# Patient Record
Sex: Female | Born: 1970 | Race: Black or African American | Hispanic: No | Marital: Single | Smoking: Current every day smoker
Health system: Southern US, Community
[De-identification: ages and names within clinical notes are randomized; demographics above are authoritative.]

## PROBLEM LIST (undated history)

## (undated) DIAGNOSIS — N760 Acute vaginitis: Secondary | ICD-10-CM

## (undated) DIAGNOSIS — M549 Dorsalgia, unspecified: Secondary | ICD-10-CM

## (undated) DIAGNOSIS — F32A Depression, unspecified: Secondary | ICD-10-CM

## (undated) DIAGNOSIS — E119 Type 2 diabetes mellitus without complications: Secondary | ICD-10-CM

## (undated) DIAGNOSIS — N730 Acute parametritis and pelvic cellulitis: Secondary | ICD-10-CM

## (undated) DIAGNOSIS — F4310 Post-traumatic stress disorder, unspecified: Secondary | ICD-10-CM

## (undated) DIAGNOSIS — R079 Chest pain, unspecified: Secondary | ICD-10-CM

## (undated) DIAGNOSIS — F329 Major depressive disorder, single episode, unspecified: Secondary | ICD-10-CM

## (undated) DIAGNOSIS — B9689 Other specified bacterial agents as the cause of diseases classified elsewhere: Secondary | ICD-10-CM

## (undated) HISTORY — PX: APPENDECTOMY: SHX54

## (undated) HISTORY — DX: Other specified bacterial agents as the cause of diseases classified elsewhere: B96.89

## (undated) HISTORY — DX: Acute parametritis and pelvic cellulitis: N73.0

## (undated) HISTORY — DX: Dorsalgia, unspecified: M54.9

## (undated) HISTORY — DX: Other specified bacterial agents as the cause of diseases classified elsewhere: N76.0

## (undated) HISTORY — DX: Chest pain, unspecified: R07.9

---

## 2006-07-18 ENCOUNTER — Emergency Department (HOSPITAL_COMMUNITY): Admission: EM | Admit: 2006-07-18 | Discharge: 2006-07-19 | Payer: Self-pay | Admitting: Emergency Medicine

## 2006-08-21 ENCOUNTER — Emergency Department (HOSPITAL_COMMUNITY): Admission: EM | Admit: 2006-08-21 | Discharge: 2006-08-22 | Payer: Self-pay | Admitting: Emergency Medicine

## 2007-01-09 ENCOUNTER — Ambulatory Visit: Payer: Self-pay | Admitting: Internal Medicine

## 2007-02-18 ENCOUNTER — Emergency Department (HOSPITAL_COMMUNITY): Admission: EM | Admit: 2007-02-18 | Discharge: 2007-02-18 | Payer: Self-pay | Admitting: Emergency Medicine

## 2007-04-10 ENCOUNTER — Telehealth (INDEPENDENT_AMBULATORY_CARE_PROVIDER_SITE_OTHER): Payer: Self-pay | Admitting: Internal Medicine

## 2007-04-21 ENCOUNTER — Emergency Department (HOSPITAL_COMMUNITY): Admission: EM | Admit: 2007-04-21 | Discharge: 2007-04-22 | Payer: Self-pay | Admitting: *Deleted

## 2007-04-23 ENCOUNTER — Ambulatory Visit: Payer: Self-pay | Admitting: Nurse Practitioner

## 2007-04-23 DIAGNOSIS — F329 Major depressive disorder, single episode, unspecified: Secondary | ICD-10-CM

## 2007-04-23 DIAGNOSIS — E669 Obesity, unspecified: Secondary | ICD-10-CM

## 2007-04-23 DIAGNOSIS — D179 Benign lipomatous neoplasm, unspecified: Secondary | ICD-10-CM | POA: Insufficient documentation

## 2007-04-23 DIAGNOSIS — F17200 Nicotine dependence, unspecified, uncomplicated: Secondary | ICD-10-CM | POA: Insufficient documentation

## 2007-04-23 DIAGNOSIS — J309 Allergic rhinitis, unspecified: Secondary | ICD-10-CM | POA: Insufficient documentation

## 2007-04-23 LAB — CONVERTED CEMR LAB
BUN: 17 mg/dL (ref 6–23)
CO2: 25 meq/L (ref 19–32)
Calcium: 9.4 mg/dL (ref 8.4–10.5)
Chloride: 103 meq/L (ref 96–112)
Cholesterol: 79 mg/dL (ref 0–200)
Creatinine, Ser: 0.67 mg/dL (ref 0.40–1.20)
Eosinophils Relative: 3 % (ref 0–5)
HCT: 43.9 % (ref 36.0–46.0)
HDL: 41 mg/dL (ref >39)
Hemoglobin: 14.2 g/dL (ref 12.0–15.0)
Lymphocytes Relative: 46 % (ref 12–46)
MCHC: 32.3 g/dL (ref 30.0–36.0)
Monocytes Absolute: 0.2 10*3/uL (ref 0.2–0.7)
Monocytes Relative: 4 % (ref 3–11)
Neutro Abs: 2.8 10*3/uL (ref 1.7–7.7)
RBC: 4.99 M/uL (ref 3.87–5.11)
RDW: 12.9 % (ref 11.5–14.0)
Total Bilirubin: 0.4 mg/dL (ref 0.3–1.2)
Total CHOL/HDL Ratio: 1.9
Triglycerides: 69 mg/dL (ref <150)
VLDL: 14 mg/dL (ref 0–40)

## 2007-04-24 ENCOUNTER — Encounter (INDEPENDENT_AMBULATORY_CARE_PROVIDER_SITE_OTHER): Payer: Self-pay | Admitting: Nurse Practitioner

## 2007-05-03 ENCOUNTER — Emergency Department (HOSPITAL_COMMUNITY): Admission: EM | Admit: 2007-05-03 | Discharge: 2007-05-03 | Payer: Self-pay | Admitting: Emergency Medicine

## 2007-05-25 ENCOUNTER — Inpatient Hospital Stay (HOSPITAL_COMMUNITY): Admission: EM | Admit: 2007-05-25 | Discharge: 2007-05-27 | Payer: Self-pay | Admitting: Emergency Medicine

## 2007-06-02 ENCOUNTER — Encounter (INDEPENDENT_AMBULATORY_CARE_PROVIDER_SITE_OTHER): Payer: Self-pay | Admitting: Nurse Practitioner

## 2008-03-10 ENCOUNTER — Emergency Department (HOSPITAL_COMMUNITY): Admission: EM | Admit: 2008-03-10 | Discharge: 2008-03-10 | Payer: Self-pay | Admitting: Emergency Medicine

## 2008-04-14 ENCOUNTER — Emergency Department (HOSPITAL_COMMUNITY): Admission: EM | Admit: 2008-04-14 | Discharge: 2008-04-15 | Payer: Self-pay | Admitting: Emergency Medicine

## 2008-10-29 ENCOUNTER — Emergency Department (HOSPITAL_COMMUNITY): Admission: EM | Admit: 2008-10-29 | Discharge: 2008-10-29 | Payer: Self-pay | Admitting: Emergency Medicine

## 2008-12-15 ENCOUNTER — Emergency Department (HOSPITAL_COMMUNITY): Admission: EM | Admit: 2008-12-15 | Discharge: 2008-12-15 | Payer: Self-pay | Admitting: Emergency Medicine

## 2009-01-28 ENCOUNTER — Emergency Department (HOSPITAL_COMMUNITY): Admission: EM | Admit: 2009-01-28 | Discharge: 2009-01-28 | Payer: Self-pay | Admitting: Emergency Medicine

## 2009-04-20 ENCOUNTER — Inpatient Hospital Stay (HOSPITAL_COMMUNITY): Admission: AD | Admit: 2009-04-20 | Discharge: 2009-04-20 | Payer: Self-pay | Admitting: Obstetrics & Gynecology

## 2009-04-20 ENCOUNTER — Emergency Department (HOSPITAL_COMMUNITY): Admission: EM | Admit: 2009-04-20 | Discharge: 2009-04-20 | Payer: Self-pay | Admitting: Emergency Medicine

## 2009-04-22 ENCOUNTER — Ambulatory Visit (HOSPITAL_COMMUNITY): Admission: AD | Admit: 2009-04-22 | Discharge: 2009-04-22 | Payer: Self-pay | Admitting: Family Medicine

## 2009-05-01 ENCOUNTER — Encounter: Payer: Self-pay | Admitting: Family Medicine

## 2009-05-01 ENCOUNTER — Inpatient Hospital Stay (HOSPITAL_COMMUNITY): Admission: RE | Admit: 2009-05-01 | Discharge: 2009-05-01 | Payer: Self-pay | Admitting: Family Medicine

## 2009-05-08 ENCOUNTER — Ambulatory Visit (HOSPITAL_COMMUNITY): Admission: AD | Admit: 2009-05-08 | Discharge: 2009-05-08 | Payer: Self-pay | Admitting: Obstetrics & Gynecology

## 2010-05-22 ENCOUNTER — Emergency Department (HOSPITAL_COMMUNITY): Admission: EM | Admit: 2010-05-22 | Discharge: 2010-05-22 | Payer: Self-pay | Admitting: Emergency Medicine

## 2010-05-28 ENCOUNTER — Emergency Department (HOSPITAL_COMMUNITY): Admission: EM | Admit: 2010-05-28 | Discharge: 2010-05-28 | Payer: Self-pay | Admitting: Emergency Medicine

## 2010-07-22 ENCOUNTER — Encounter: Payer: Self-pay | Admitting: Obstetrics & Gynecology

## 2010-09-11 LAB — WOUND CULTURE

## 2010-10-04 LAB — CBC
MCHC: 33 g/dL (ref 30.0–36.0)
Platelets: 177 10*3/uL (ref 150–400)
RBC: 5.09 MIL/uL (ref 3.87–5.11)
RDW: 13.2 % (ref 11.5–15.5)
WBC: 6.6 10*3/uL (ref 4.0–10.5)

## 2010-10-04 LAB — POCT URINALYSIS DIP (DEVICE)
Bilirubin Urine: NEGATIVE
Glucose, UA: NEGATIVE mg/dL
Nitrite: NEGATIVE
pH: 5.5 (ref 5.0–8.0)

## 2010-10-04 LAB — POCT PREGNANCY, URINE: Preg Test, Ur: POSITIVE

## 2010-10-08 LAB — STREP A DNA PROBE: Group A Strep Probe: NEGATIVE

## 2010-10-08 LAB — RAPID STREP SCREEN (MED CTR MEBANE ONLY): Streptococcus, Group A Screen (Direct): NEGATIVE

## 2010-11-13 NOTE — Discharge Summary (Signed)
[name], [name]                 ACCOUNT NO.:  [id number]   MEDICAL RECORD NO.:  [id number]          PATIENT TYPE:  INP   LOCATION:  1512                         FACILITY:  [facility]   PHYSICIAN:  Hillery Aldo, M.D.   DATE OF BIRTH:  1971/05/27   DATE OF ADMISSION:  05/25/2007  DATE OF DISCHARGE:  05/27/2007                               DISCHARGE SUMMARY   PRIMARY CARE PHYSICIAN:  Dr. Delrae Alfred at [facility].   DISCHARGE DIAGNOSES:  1. Intentional overdose with Lexapro.  2. Depression.  3. Suicide attempt.  4. Alcohol intoxication.  5. Anxiety disorder.   DISCHARGE MEDICATIONS:  1. Wellbutrin SR 50 mg daily.  2. Trazodone 50 mg at bedtime.   NOTE:  The patient's Lexapro has been discontinued.   BRIEF ADMISSION HISTORY OF PRESENT ILLNESS:  The patient is a 40-year-  old female who presented with an intentional overdose of 25-30 Lexapro  tablets secondary to feeling overwhelmed by family issues, in a suicide  attempt.  For the full details, please see the dictated report done by  Dr. Lovell Sheehan.   CONSULTATIONS:  Dr. Jeanie Sewer of psychiatry.   PROCEDURES AND DIAGNOSTIC STUDIES:  None.   DISCHARGE LABORATORY VALUES:  TSH was 0.354.  CBC was within normal  limits.  Chemistries were within normal limits.   HOSPITAL COURSE:  Suicide attempt, with Lexapro overdose:  The patient  was admitted and put on seizure precautions as well as suicide  precautions.  She was put on IV fluid rehydration and p.r.n.  antiemetics.  She did not have any symptomatic issues during the course  of her hospitalization.  A psychiatry consult was requested and kindly  provided by Dr. Jeanie Sewer, who felt that the patient was not  committable.  The 24-hour sitter was discontinued, and the patient  remained stable.  She was not felt to be at any harm to herself after  recovering from her acute intoxicated state.  She did agree to call  emergency services for any thoughts of harming self or other  psychiatric  emergency symptoms.  She declined admission to an inpatient psychiatric  unit and was put on Wellbutrin for antidepression and trazodone for help  with her anxiety and insomnia symptoms.  She is instructed to  follow up with a psychiatric clinic within 1 week.  This information  regarding followup was provided to the patient by the social worker.  She is medically stable and being discharged today.  She should follow  up with Dr. Delrae Alfred in 1-2 weeks' time.      Hillery Aldo, M.D.  Electronically Signed     CR/MEDQ  D:  05/27/2007  T:  05/27/2007  Job:  [id number]   cc:   Marcene Duos, M.D.

## 2010-11-13 NOTE — H&P (Signed)
[name], [name]                 ACCOUNT NO.:  [id number]   MEDICAL RECORD NO.:  [id number]          PATIENT TYPE:  INP   LOCATION:  1512                         FACILITY:  [facility]   PHYSICIAN:  Della Goo, M.D. DATE OF BIRTH:  05/16/1971   DATE OF ADMISSION:  05/25/2007  DATE OF DISCHARGE:                              HISTORY & PHYSICAL   PRIMARY CARE PHYSICIAN:  Unassigned.   CHIEF COMPLAINT:  Overdose.   HISTORY OF PRESENT ILLNESS:  This is a 40 year old female who presented  to the emergency department after taking 25-30 Lexapro tablets.  She  reports feeling overwhelmed by her family issues and wanting to end it  all.  The patient reports having family problems, her 90 year old  daughter, and did not state the particulars.  Per review of medical  records, the patient has had previous attempts in the past.   PAST MEDICAL HISTORY:  Significant for:  1. Severe depression.  2. Asthma.  3. Bipolar disorder.  4. Anxiety.   MEDICATIONS AT THIS TIME:  Include Lexapro, multivitamins.   She has allergies to MORPHINE which caused itching.   PAST SURGICAL HISTORY:  An appendectomy.   SOCIAL HISTORY:  The patient is a smoker, smokes a half-a-pack per day,  and reports drinking occasionally.  She does report drinking one-half of  a fifth of brandy prior to arrival at the emergency department.   PHYSICAL EXAMINATION FINDINGS:  This is a 41 year old well-nourished,  well-developed female in no acute distress.  VITAL SIGNS:  Temperature 99.0, blood pressure 112/70, heart rate 93,  respirations 20, O2 saturations 97-98%.  HEENT:  Normocephalic, atraumatic.  Pupils equally round, reactive to  light.  Extraocular muscles are intact, funduscopic benign.  Oropharynx  is clear.  NECK:  Supple, full range of motion.  No thyromegaly, adenopathy,  jugulovenous distention.  CARDIOVASCULAR:  Regular rate and rhythm.  No murmurs, gallops or rubs.  LUNGS:  Clear to auscultation  bilaterally.  ABDOMEN:  Positive bowel sounds, soft, nontender, nondistended.  EXTREMITIES:  Without cyanosis, clubbing or edema.  NEUROLOGIC EXAMINATION:  The patient is alert and oriented x3.  Her mood  is depressed.  Speech is clear.  Her cranial nerves are intact.  There  are no focal deficits.   LABORATORY STUDIES:  White blood cell count 5.5, hemoglobin 14.8,  hematocrit 44.6, platelets 196, neutrophils 35%,lymphocytes 55%.  Sodium  138, potassium 3.8, chloride 107, bicarb 22, BUN 11, creatinine 0.65,  and glucose 90.  Alcohol level 76.  Urine hemoglobin negative.  Urine  drug screen negative.  Acetaminophen level less than 10.0.   ASSESSMENT:  A 40 year old female being admitted with:  1. An overdose on antidepressant medication, a selective serotonin      reuptake inhibitor.  2. Suicide attempt.  3. Bipolar disorder.  4. Severe depression.   PLAN:  The patient will be admitted to a telemetry area, placed on  seizure precautions.  A one-to-one sitter and suicide precautions have  also been ordered.  The patient will be placed on clear-liquid diet and  IV fluids along with p.r.n. antiemetic therapy.  Poison control had been  contacted for advice regarding the Lexapro overdose.  DVT and GI  prophylaxis also have been ordered and psychiatry will be consulted.      Della Goo, M.D.  Electronically Signed     HJ/MEDQ  D:  05/26/2007  T:  05/27/2007  Job:  [id number]
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
## 2011-02-04 ENCOUNTER — Inpatient Hospital Stay (HOSPITAL_COMMUNITY): Payer: Self-pay

## 2011-02-04 ENCOUNTER — Inpatient Hospital Stay (HOSPITAL_COMMUNITY)
Admission: AD | Admit: 2011-02-04 | Discharge: 2011-02-04 | Disposition: A | Discharge status: Home / Self Care | Payer: Self-pay | Source: Ambulatory Visit | Attending: Family Medicine | Admitting: Family Medicine

## 2011-02-04 ENCOUNTER — Encounter (HOSPITAL_COMMUNITY): Payer: Self-pay | Admitting: Family Medicine

## 2011-02-04 DIAGNOSIS — Y93F1 Activity, caregiving, bathing: Secondary | ICD-10-CM | POA: Insufficient documentation

## 2011-02-04 DIAGNOSIS — M549 Dorsalgia, unspecified: Secondary | ICD-10-CM

## 2011-02-04 DIAGNOSIS — S0990XA Unspecified injury of head, initial encounter: Secondary | ICD-10-CM | POA: Insufficient documentation

## 2011-02-04 DIAGNOSIS — O99891 Other specified diseases and conditions complicating pregnancy: Secondary | ICD-10-CM | POA: Insufficient documentation

## 2011-02-04 DIAGNOSIS — Z3201 Encounter for pregnancy test, result positive: Secondary | ICD-10-CM

## 2011-02-04 DIAGNOSIS — Y92009 Unspecified place in unspecified non-institutional (private) residence as the place of occurrence of the external cause: Secondary | ICD-10-CM | POA: Insufficient documentation

## 2011-02-04 DIAGNOSIS — W010XXA Fall on same level from slipping, tripping and stumbling without subsequent striking against object, initial encounter: Secondary | ICD-10-CM | POA: Insufficient documentation

## 2011-02-04 LAB — URINALYSIS, ROUTINE W REFLEX MICROSCOPIC
Glucose, UA: NEGATIVE mg/dL
Hgb urine dipstick: NEGATIVE
Ketones, ur: NEGATIVE mg/dL
Protein, ur: NEGATIVE mg/dL
Urobilinogen, UA: 0.2 mg/dL (ref 0.0–1.0)

## 2011-02-04 LAB — URINE MICROSCOPIC-ADD ON

## 2011-02-04 MED ORDER — ACETAMINOPHEN 500 MG PO TABS
1000.0000 mg | ORAL_TABLET | Freq: Once | ORAL | Status: AC
  Administered 2011-02-04: 1000 mg via ORAL
  Filled 2011-02-04: qty 2

## 2011-02-04 NOTE — ED Notes (Cosign Needed)
I took over care of [name] from [place]. The patient has returned from ultrasound and has a 6 week IUGS with Y[facility] and her CT scan of her head was normal. I will discharge the patient home with instructions on pregnancy and minor head injury. She is to follow up with her regular doctor. She will return here as needed.   [place], [place] 02/04/11 2135

## 2011-02-04 NOTE — ED Notes (Signed)
H NEESE NP NOTIFIED OB/US 6WKS IUP YOLK SAC NO FETAL POLE. CT/HEAD NEGATIVE.  NO NEW ORDERS AT THIS TIME.

## 2011-02-04 NOTE — Progress Notes (Signed)
Pt states around 1730 this afternoon, pt states she was rushing and slipped in the shower fell back and hit her head and back. Pt states that she is having abdominal cramping, intense back pain a headache. White itching discharge. No vaginal bleeding.

## 2011-02-04 NOTE — ED Provider Notes (Signed)
Chief Complaint:  [name] is  40 y.o. [id number].  Patient's last menstrual period was 11/25/2010.Marland Kitchen  Her pregnancy status is positive.  She presents complaining of Fall .Pt reports slipping and falling onto her back and head in shower at 1715.  She currently reports a headache, midback pain, and mild abdominal cramping.  She denies any VB.  She is unsure of her LMP but thinks it was the end of May, she recently took a HPT which was positive.  She reports a 10lb weight gain in past few weeks   Obstetrical/Gynecological History: OB History    Grav Para Term Preterm Abortions TAB SAB Ect Mult Living   7 4 4  2  1   4       Past Medical History: Past Medical History  Diagnosis Date  . No pertinent past medical history     Past Surgical History: Past Surgical History  Procedure Date  . Appendectomy     Family History: No family history on file.  Social History: History  Substance Use Topics  . Smoking status: Former Games developer  . Smokeless tobacco: Not on file  . Alcohol Use: No    Allergies:  Allergies  Allergen Reactions  . Morphine Itching    Prescriptions prior to admission  Medication Sig Dispense Refill  . Multiple Vitamins-Minerals (MULTIVITAMIN WITH MINERALS) tablet Take 1 tablet by mouth daily.          Review of Systems - General ROS: negative for - chills or fever Ophthalmic ROS: negative for - blurry vision ENT ROS: negative for - headaches Respiratory ROS: no cough, shortness of breath, or wheezing Cardiovascular ROS: no chest pain or dyspnea on exertion Gastrointestinal ROS: postive for abdominal cramping  Physical Exam   Blood pressure 115/72, pulse 72, temperature 98.3 F (36.8 C), temperature source Oral, resp. rate 20, height 5' 4.5" (1.638 m), weight 183 lb (83.008 kg), last menstrual period 11/25/2010.  General: General appearance - oriented to person, place, and time and in mild to moderate distress Chest - clear to auscultation, no  wheezes, rales or rhonchi, symmetric air entry Abdomen - soft, nontender, nondistended, no masses or organomegaly Extremities - peripheral pulses normal, no pedal edema, no clubbing or cyanosis MSK - TTP along thoracic vertebrae  Labs: Recent Results (from the past 24 hour(s))  POCT PREGNANCY, URINE   Collection Time   02/04/11  7:06 PM      Component Value Range   Preg Test, Ur POSITIVE     Imaging Studies:  No results found.   Assessment: Patient Active Problem List  Diagnoses  . LIPOMA  . OBESITY  . TOBACCO ABUSE  . DEPRESSION  . ALLERGIC RHINITIS    Plan: 1.  Fall - Will give pt tylenols for pain since she drove here 2. Pregnancy - will obtain an TVUS to assess dates and assess for bleeding.  Will adjust management according to results.   I have discussed this case with Dorathy Kinsman CNM who is in agreement with this plan  Lindaann Slough.

## 2011-02-04 NOTE — Progress Notes (Signed)
Slipped in tub, fell back, hit head and low back.  Cramping in lower abd now.  No bleeding.  +HPT 5 days ago.

## 2011-03-02 ENCOUNTER — Inpatient Hospital Stay (HOSPITAL_COMMUNITY): Payer: Self-pay

## 2011-03-02 ENCOUNTER — Inpatient Hospital Stay (HOSPITAL_COMMUNITY)
Admission: AD | Admit: 2011-03-02 | Discharge: 2011-03-02 | Disposition: A | Discharge status: Home / Self Care | Payer: Self-pay | Source: Ambulatory Visit | Attending: Obstetrics and Gynecology | Admitting: Obstetrics and Gynecology

## 2011-03-02 ENCOUNTER — Encounter (HOSPITAL_COMMUNITY): Payer: Self-pay | Admitting: Obstetrics and Gynecology

## 2011-03-02 DIAGNOSIS — O034 Incomplete spontaneous abortion without complication: Secondary | ICD-10-CM | POA: Insufficient documentation

## 2011-03-02 LAB — CBC
HCT: 38.5 % (ref 36.0–46.0)
MCV: 83.9 fL (ref 78.0–100.0)
RBC: 4.59 MIL/uL (ref 3.87–5.11)
RDW: 12.8 % (ref 11.5–15.5)
WBC: 5.1 10*3/uL (ref 4.0–10.5)

## 2011-03-02 LAB — URINE MICROSCOPIC-ADD ON

## 2011-03-02 LAB — URINALYSIS, ROUTINE W REFLEX MICROSCOPIC
Specific Gravity, Urine: 1.02 (ref 1.005–1.030)
Urobilinogen, UA: 0.2 mg/dL (ref 0.0–1.0)
pH: 6.5 (ref 5.0–8.0)

## 2011-03-02 LAB — WET PREP, GENITAL

## 2011-03-02 MED ORDER — ONDANSETRON 4 MG PO TBDP
4.0000 mg | ORAL_TABLET | Freq: Once | ORAL | Status: DC
  Filled 2011-03-02: qty 1

## 2011-03-02 MED ORDER — PROMETHAZINE HCL 25 MG PO TABS: 25.0000 mg | ORAL_TABLET | Freq: Once | ORAL | Status: AC

## 2011-03-02 MED ORDER — KETOROLAC TROMETHAMINE 60 MG/2ML IM SOLN
60.0000 mg | Freq: Once | INTRAMUSCULAR | Status: AC
  Administered 2011-03-02: 60 mg via INTRAMUSCULAR
  Filled 2011-03-02: qty 2

## 2011-03-02 MED ORDER — PROMETHAZINE HCL 25 MG PO TABS
25.0000 mg | ORAL_TABLET | Freq: Once | ORAL | Status: AC
  Administered 2011-03-02: 25 mg via ORAL
  Filled 2011-03-02: qty 1

## 2011-03-02 NOTE — ED Provider Notes (Signed)
History     Chief Complaint  Patient presents with  . Abdominal Pain  . Vaginal Bleeding   HPI Received patient from previous provider. Reviewed ultrasound:  No fetal pole or yolk sac seen.  Previous U/S showed yolk sac, but today's does not. Patient just moved from [place] and does not have insurance yet (works for [employer] and gets insurance in October), however pregnancy is much desired.  ROS Physical Exam   Blood pressure 106/65, pulse 74, temperature 98.8 F (37.1 C), temperature source Oral, resp. rate 20, last menstrual period 11/25/2010.  Physical Exam See U/S report.  MAU Course  Procedures   Assessment and Plan  IUP at 6+ weeks, anembryonic Early cramping and bleeding Nausea  Will change Zofran to Phenergan for relaxation effect Will give Rx for Phenergan  Discussed medical management vs expectant management Pt prefers expectant for now Pt upset and sad Friend will drive her home.  Follow up in clinic   [facility] 03/02/2011, 9:30 AM

## 2011-03-02 NOTE — Progress Notes (Signed)
Miscarriage at 9 weeks. [name] discussed medication options with patient. Friend at bedside, comfort measures offered (kleenex, emotional support). Pt tearful.

## 2011-03-02 NOTE — ED Provider Notes (Signed)
Attestation of Attending Supervision of Advanced Practitioner: Evaluation and management procedures were performed by the PA/NP/CNM/OB Fellow under my supervision/collaboration. Chart reviewed and agree with management and plan.  ANYANWU,UGONNA A 03/02/2011 11:59 AM   

## 2011-03-02 NOTE — ED Provider Notes (Signed)
History     Chief Complaint  Patient presents with  . Abdominal Pain  . Vaginal Bleeding   HPI  Pt is here with report of spotting of blood that started at 0300 this morning.  +cramping in the midpelvic region.  Also reports increased nausea and vomiting, unable to hold down food or water.  Past Medical History  Diagnosis Date  . No pertinent past medical history     Past Surgical History  Procedure Date  . Appendectomy     No family history on file.  History  Substance Use Topics  . Smoking status: Former Games developer  . Smokeless tobacco: Not on file  . Alcohol Use: No    Allergies:  Allergies  Allergen Reactions  . Morphine Itching    Prescriptions prior to admission  Medication Sig Dispense Refill  . ibuprofen (ADVIL,MOTRIN) 600 MG tablet Take 600 mg by mouth as needed. For teeth pain       . Multiple Vitamins-Minerals (MULTIVITAMIN WITH MINERALS) tablet Take 1 tablet by mouth daily.        . prenatal vitamin w/FE, FA (PRENATAL 1 + 1) 27-1 MG TABS Take 1 tablet by mouth daily.          Review of Systems  Constitutional: Negative.   Gastrointestinal: Positive for nausea, vomiting and abdominal pain.  Genitourinary: Positive for frequency. Negative for dysuria, hematuria and flank pain.  Neurological: Negative for dizziness.   Physical Exam   Blood pressure 106/65, pulse 74, temperature 98.8 F (37.1 C), temperature source Oral, resp. rate 20, last menstrual period 11/25/2010.  Physical Exam  Constitutional: She is oriented to person, place, and time. She appears well-developed and well-nourished.  HENT:  Head: Normocephalic.  Neck: Normal range of motion. Neck supple.  Cardiovascular: Normal rate, regular rhythm and normal heart sounds.  Exam reveals no gallop and no friction rub.   No murmur heard. Respiratory: Effort normal and breath sounds normal. No respiratory distress.  GI: She exhibits no mass. There is tenderness (midpelvic). There is no rebound,  no guarding and no CVA tenderness.  Genitourinary: Uterus is enlarged. Cervix exhibits no motion tenderness and no discharge. There is bleeding around the vagina. Vaginal discharge (creamy, pink tinged discharge) found.  Musculoskeletal: Normal range of motion.  Neurological: She is alert and oriented to person, place, and time.  Skin: Skin is warm and dry.  Psychiatric: She has a normal mood and affect.    MAU Course  Procedures [place] CBC  BHCG ABORH  Assessment and Plan  Report given to Wynelle Bourgeois who assumes care of pt.  [facility] 03/02/2011, 7:50 AM

## 2011-03-02 NOTE — Progress Notes (Signed)
Pt presents to MAU with complaints of spotting that started this AM> Pt states yesterday she noticed abdominal cramping. Pt is a G7P4

## 2011-03-12 ENCOUNTER — Encounter (HOSPITAL_COMMUNITY): Payer: Self-pay | Admitting: *Deleted

## 2011-03-12 ENCOUNTER — Inpatient Hospital Stay (HOSPITAL_COMMUNITY)
Admission: AD | Admit: 2011-03-12 | Discharge: 2011-03-13 | Disposition: A | Discharge status: Home / Self Care | Payer: Self-pay | Source: Ambulatory Visit | Attending: Obstetrics & Gynecology | Admitting: Obstetrics & Gynecology

## 2011-03-12 DIAGNOSIS — O021 Missed abortion: Secondary | ICD-10-CM

## 2011-03-12 DIAGNOSIS — O039 Complete or unspecified spontaneous abortion without complication: Secondary | ICD-10-CM | POA: Insufficient documentation

## 2011-03-12 HISTORY — DX: Major depressive disorder, single episode, unspecified: F32.9

## 2011-03-12 HISTORY — DX: Depression, unspecified: F32.A

## 2011-03-12 LAB — CBC
HCT: 37.6 % (ref 36.0–46.0)
MCV: 84.1 fL (ref 78.0–100.0)
RBC: 4.47 MIL/uL (ref 3.87–5.11)
WBC: 8 10*3/uL (ref 4.0–10.5)

## 2011-03-12 MED ORDER — KETOROLAC TROMETHAMINE 60 MG/2ML IM SOLN
60.0000 mg | Freq: Once | INTRAMUSCULAR | Status: AC
  Administered 2011-03-12: 60 mg via INTRAMUSCULAR
  Filled 2011-03-12: qty 2

## 2011-03-12 MED ORDER — MISOPROSTOL 200 MCG PO TABS
800.0000 ug | ORAL_TABLET | Freq: Once | ORAL | Status: AC
  Administered 2011-03-13: 800 ug via VAGINAL
  Filled 2011-03-12: qty 4

## 2011-03-12 NOTE — Progress Notes (Signed)
Pt reports that she was diagnosed with a miscarriage on 8/1 and is now having bleeding and cramping.

## 2011-03-12 NOTE — Progress Notes (Signed)
Pt seen in MAU 03/02/2011 with bleeding during pregnancy, U/S-no yolk sac.  Pt having cramping, back pain, migraine headache and dizziness.  Pt denies passing any tissue and feels that she is getting worse.

## 2011-03-12 NOTE — ED Provider Notes (Signed)
History     Chief Complaint  Patient presents with  . Abdominal Cramping  . Back Pain  . Dizziness   HPI [name] 40 y.o. seen here on 02-04-11 with ultrasound showing 6w IUGS and yolk sac.  Client returned on 03-02-11 and had BHCG of 29,027 and ultrasound showed no yolk sac and no fetal pole.  Has been bleeding and cramping since 03-02-11.  Bleeding has been minimal.  Has not had heavy bleeding or passing of any tissue.  Has had vomiting still.   OB History    Grav Para Term Preterm Abortions TAB SAB Ect Mult Living   7 4 4  0 2 1 1  0 0 4      Past Medical History  Diagnosis Date  . Depression     Past Surgical History  Procedure Date  . Appendectomy     History reviewed. No pertinent family history.  History  Substance Use Topics  . Smoking status: Former Games developer  . Smokeless tobacco: Not on file  . Alcohol Use: No    Allergies:  Allergies  Allergen Reactions  . Morphine Itching    Prescriptions prior to admission  Medication Sig Dispense Refill  . ibuprofen (ADVIL,MOTRIN) 600 MG tablet Take 600 mg by mouth as needed. For teeth pain       . Multiple Vitamins-Minerals (MULTIVITAMIN WITH MINERALS) tablet Take 1 tablet by mouth daily.        . prenatal vitamin w/FE, FA (PRENATAL 1 + 1) 27-1 MG TABS Take 1 tablet by mouth daily.        . promethazine (PHENERGAN) 25 MG tablet Take 25 mg by mouth every 6 (six) hours as needed.          ROS Physical Exam   Blood pressure 98/62, pulse 66, temperature 98.5 F (36.9 C), temperature source Oral, resp. rate 18, height 5\' 4"  (1.626 m), weight 188 lb 9.6 oz (85.548 kg), last menstrual period 11/25/2010.  Physical Exam  Nursing note and vitals reviewed. Constitutional: She is oriented to person, place, and time. She appears well-developed and well-nourished.  HENT:  Head: Normocephalic.  Eyes: EOM are normal.  Neck: Neck supple.  Musculoskeletal: Normal range of motion.  Neurological: She is alert and oriented to  person, place, and time.  Skin: Skin is warm and dry.  Psychiatric: She has a normal mood and affect.    MAU Course  Procedures  MDM Results for orders placed during the hospital encounter of 03/12/11 (from the past 24 hour(s))  CBC     Status: Normal   Collection Time   03/12/11  9:17 PM      Component Value Range   WBC 8.0  4.0 - 10.5 (K/uL)   RBC 4.47  3.87 - 5.11 (MIL/uL)   Hemoglobin 12.3  12.0 - 15.0 (g/dL)   HCT 62.1  30.8 - 65.7 (%)   MCV 84.1  78.0 - 100.0 (fL)   MCH 27.5  26.0 - 34.0 (pg)   MCHC 32.7  30.0 - 36.0 (g/dL)   RDW 84.6  96.2 - 95.2 (%)   Platelets 181  150 - 400 (K/uL)  HCG, QUANTITATIVE, PREGNANCY     Status: Abnormal   Collection Time   03/12/11  9:17 PM      Component Value Range   hCG, Beta Chain, Quant, S 8065 (*) <5 (mIU/mL)   Discussed Cytotec and client consents to use Cytotec vaginally to assist in spontaneous miscarriage of nonviable pregnancy. Blood type  B pos  Assessment and Plan  Nonviable pregnancy Miscarriage  Plan: Cytotec vaginally Follow up in GYN clinic in 4 weeks.  [name] 03/12/2011, 11:58 PM   Nolene Bernheim, NP 03/13/11 0007

## 2011-03-13 MED ORDER — ONDANSETRON 8 MG PO TBDP: 8.0000 mg | ORAL_TABLET | Freq: Three times a day (TID) | ORAL | Status: AC | PRN

## 2011-03-13 MED ORDER — OXYCODONE-ACETAMINOPHEN 5-325 MG PO TABS: 2.0000 | ORAL_TABLET | ORAL | Status: AC | PRN

## 2011-03-19 ENCOUNTER — Inpatient Hospital Stay (HOSPITAL_COMMUNITY)
Admission: AD | Admit: 2011-03-19 | Discharge: 2011-03-20 | Disposition: A | Discharge status: Home / Self Care | Payer: Self-pay | Source: Ambulatory Visit | Attending: Obstetrics & Gynecology | Admitting: Obstetrics & Gynecology

## 2011-03-19 DIAGNOSIS — O021 Missed abortion: Secondary | ICD-10-CM

## 2011-03-19 NOTE — Progress Notes (Signed)
Pt has been taking cytotec for miscarriage since sept 11th.  At first states bleeding wasn't that bad.  States nausea has increased. Has been going through 4 or 5 pads a day.  Started getting worse when she went back to work this weekend.

## 2011-03-19 NOTE — Progress Notes (Signed)
Pt states that on 09/01 she was told she would micarry-last Tues she was given cytotec-since then she has had severe cramping and the bleeding is heavier now-percocet makes her very nauseated, and is asking for pain meds

## 2011-03-20 ENCOUNTER — Encounter (HOSPITAL_COMMUNITY): Payer: Self-pay | Admitting: *Deleted

## 2011-03-20 ENCOUNTER — Inpatient Hospital Stay (HOSPITAL_COMMUNITY): Payer: Self-pay

## 2011-03-20 LAB — CBC
HCT: 37 % (ref 36.0–46.0)
Hemoglobin: 12.3 g/dL (ref 12.0–15.0)
MCH: 28.2 pg (ref 26.0–34.0)
MCHC: 33.2 g/dL (ref 30.0–36.0)
MCV: 84.9 fL (ref 78.0–100.0)
RBC: 4.36 MIL/uL (ref 3.87–5.11)

## 2011-03-20 LAB — HCG, QUANTITATIVE, PREGNANCY: hCG, Beta Chain, Quant, S: 2541 m[IU]/mL — ABNORMAL HIGH (ref <5)

## 2011-03-20 MED ORDER — HYDROMORPHONE HCL 1 MG/ML IJ SOLN
2.0000 mg | Freq: Once | INTRAMUSCULAR | Status: AC
  Administered 2011-03-20: 2 mg via INTRAMUSCULAR
  Filled 2011-03-20: qty 2

## 2011-03-20 MED ORDER — ONDANSETRON 4 MG PO TBDP
4.0000 mg | ORAL_TABLET | Freq: Once | ORAL | Status: AC
  Administered 2011-03-20: 4 mg via ORAL
  Filled 2011-03-20: qty 1

## 2011-03-20 MED ORDER — OXYCODONE-ACETAMINOPHEN 5-325 MG PO TABS: 1.0000 | ORAL_TABLET | ORAL | Status: AC | PRN

## 2011-03-20 NOTE — ED Provider Notes (Signed)
History   Pt presents today c/o lower abd pain and heavy vag bleeding. She was diagnosed with a missed AB on 03/12/11 and was given cytotec vaginally. She states she has continued to have heavy bleeding and pain since that time. Now she feels weak and "just doesn't feel right." She denies fever or any other sx at this time.  Chief Complaint  Patient presents with  . Vaginal Bleeding   HPI  OB History    Grav Para Term Preterm Abortions TAB SAB Ect Mult Living   7 4 4  0 2 1 1  0 0 4      Past Medical History  Diagnosis Date  . Depression     Past Surgical History  Procedure Date  . Appendectomy     No family history on file.  History  Substance Use Topics  . Smoking status: Former Games developer  . Smokeless tobacco: Not on file  . Alcohol Use: No    Allergies:  Allergies  Allergen Reactions  . Morphine Itching    Prescriptions prior to admission  Medication Sig Dispense Refill  . oxyCODONE-acetaminophen (PERCOCET) 5-325 MG per tablet Take 2 tablets by mouth every 4 (four) hours as needed for pain.  20 tablet  0  . promethazine (PHENERGAN) 25 MG tablet Take 25 mg by mouth every 6 (six) hours as needed.        Marland Kitchen ibuprofen (ADVIL,MOTRIN) 600 MG tablet Take 600 mg by mouth as needed. For teeth pain       . Multiple Vitamins-Minerals (MULTIVITAMIN WITH MINERALS) tablet Take 1 tablet by mouth daily.        . ondansetron (ZOFRAN ODT) 8 MG disintegrating tablet Take 1 tablet (8 mg total) by mouth every 8 (eight) hours as needed for nausea. Place under tongue to dissolve.  30 tablet  0  . prenatal vitamin w/FE, FA (PRENATAL 1 + 1) 27-1 MG TABS Take 1 tablet by mouth daily.          Review of Systems  Constitutional: Negative for fever.  Cardiovascular: Negative for chest pain.  Gastrointestinal: Positive for nausea and abdominal pain. Negative for vomiting, diarrhea and constipation.  Genitourinary: Negative for dysuria, urgency, frequency and hematuria.  Neurological: Negative  for dizziness and headaches.  Psychiatric/Behavioral: Negative for depression and suicidal ideas.   Physical Exam   Blood pressure 111/74, pulse 81, temperature 98.9 F (37.2 C), temperature source Oral, resp. rate 20, height 5\' 4"  (1.626 m), weight 191 lb (86.637 kg), last menstrual period 11/25/2010.  Physical Exam  Constitutional: She is oriented to person, place, and time. She appears well-developed and well-nourished. No distress.  HENT:  Head: Normocephalic and atraumatic.  Eyes: EOM are normal. Pupils are equal, round, and reactive to light.  GI: Soft. She exhibits no distension. There is tenderness. There is no rebound and no guarding.  Genitourinary: There is bleeding around the vagina. No vaginal discharge found.       Cervix is Lg/closed. Minimal bleeding noted on exam.  Neurological: She is alert and oriented to person, place, and time.  Skin: Skin is warm and dry. She is not diaphoretic.  Psychiatric: She has a normal mood and affect. Her behavior is normal. Judgment and thought content normal.    MAU Course  Procedures  Results for orders placed during the hospital encounter of 03/19/11 (from the past 24 hour(s))  CBC     Status: Normal   Collection Time   03/20/11 12:01 AM  Component Value Range   WBC 8.0  4.0 - 10.5 (K/uL)   RBC 4.36  3.87 - 5.11 (MIL/uL)   Hemoglobin 12.3  12.0 - 15.0 (g/dL)   HCT 13.0  86.5 - 78.4 (%)   MCV 84.9  78.0 - 100.0 (fL)   MCH 28.2  26.0 - 34.0 (pg)   MCHC 33.2  30.0 - 36.0 (g/dL)   RDW 69.6  29.5 - 28.4 (%)   Platelets 172  150 - 400 (K/uL)   [place] continues to show IUGS.  Discussed pt with Dr. Marice Potter at length. Will schedule D&C as outpt procedure. Pt will be contacted in the morning to schedule the surgery.  Assessment and Plan  Missed AB: discussed with pt at length. Will give Rx for percocet to help with pain until her surgery. Discussed diet, activity, risks, and precautions. Pt will be contacted in the am to schedule  D&C.  Clinton Gallant. Anie Juniel III, DrHSc, MPAS, PA-C  03/20/2011, 12:16 AM   Henrietta Hoover, PA 03/20/11 [phone]

## 2011-03-20 NOTE — Progress Notes (Signed)
E. Rice, PA at bedside.  Assessment done and poc discussed with pt.   

## 2011-03-29 NOTE — ED Provider Notes (Signed)
Attestation of Attending Supervision of Advanced Practitioner: Evaluation and management procedures were performed by the PA/NP/CNM/OB Fellow under my supervision/collaboration. Chart reviewed and agree with management and plan.  ANYANWU,UGONNA A 03/29/2011 3:12 PM

## 2011-04-01 ENCOUNTER — Ambulatory Visit: Payer: Self-pay | Admitting: Obstetrics and Gynecology

## 2011-04-03 LAB — CBC
HCT: 44.9
MCHC: 32.5
MCV: 87.8
Platelets: 152
WBC: 5.5

## 2011-04-03 LAB — DIFFERENTIAL
Basophils Absolute: 0.1
Eosinophils Relative: 4
Lymphs Abs: 2.1
Monocytes Absolute: 0.1
Monocytes Relative: 2 — ABNORMAL LOW
Neutro Abs: 3

## 2011-04-03 LAB — POCT I-STAT, CHEM 8
BUN: 15
Calcium, Ion: 0.93 — ABNORMAL LOW
Chloride: 111
Creatinine, Ser: 1.1
Glucose, Bld: 108 — ABNORMAL HIGH

## 2011-04-09 LAB — BASIC METABOLIC PANEL
Calcium: 8.4
Chloride: 107
Chloride: 111
Creatinine, Ser: 0.75
GFR calc Af Amer: >60
GFR calc Af Amer: >60
Potassium: 3.8

## 2011-04-09 LAB — CBC
HCT: 44.6
MCV: 85
MCV: 85.8
RBC: 4.55
RBC: 5.19 — ABNORMAL HIGH
WBC: 5.4
WBC: 5.5

## 2011-04-09 LAB — DIFFERENTIAL
Eosinophils Absolute: 0.2
Eosinophils Relative: 4
Lymphs Abs: 3.1
Monocytes Absolute: 0.3
Monocytes Relative: 6

## 2011-04-09 LAB — URINALYSIS, ROUTINE W REFLEX MICROSCOPIC
Hgb urine dipstick: NEGATIVE
Protein, ur: NEGATIVE
Urobilinogen, UA: 0.2

## 2011-04-09 LAB — TSH: TSH: 0.354

## 2011-04-09 LAB — RAPID URINE DRUG SCREEN, HOSP PERFORMED
Amphetamines: NOT DETECTED
Tetrahydrocannabinol: NOT DETECTED

## 2011-04-09 LAB — ETHANOL: Alcohol, Ethyl (B): 76 — ABNORMAL HIGH

## 2011-04-09 LAB — HEPATIC FUNCTION PANEL
Alkaline Phosphatase: 49
Total Protein: 7.7

## 2011-04-09 LAB — PREGNANCY, URINE: Preg Test, Ur: NEGATIVE

## 2011-04-10 LAB — CBC
RBC: 4.86
WBC: 6.7

## 2011-04-10 LAB — DIFFERENTIAL
Basophils Absolute: 0
Eosinophils Absolute: 0.3
Eosinophils Relative: 4
Lymphs Abs: 3.1
Monocytes Absolute: 0.2

## 2011-04-10 LAB — COMPREHENSIVE METABOLIC PANEL
ALT: 21
AST: 17
Albumin: 3.1 — ABNORMAL LOW
CO2: 25
Chloride: 101
GFR calc Af Amer: >60
GFR calc non Af Amer: >60
Sodium: 131 — ABNORMAL LOW
Total Bilirubin: 0.5

## 2011-04-10 LAB — URINALYSIS, ROUTINE W REFLEX MICROSCOPIC
Nitrite: NEGATIVE
Specific Gravity, Urine: 1.026
Urobilinogen, UA: 0.2

## 2011-04-10 LAB — LIPASE, BLOOD: Lipase: 50

## 2011-04-10 LAB — PREGNANCY, URINE: Preg Test, Ur: NEGATIVE

## 2011-05-10 ENCOUNTER — Inpatient Hospital Stay (HOSPITAL_COMMUNITY)
Admission: AD | Admit: 2011-05-10 | Discharge: 2011-05-11 | Disposition: A | Discharge status: Home / Self Care | Payer: Medicaid Other | Source: Ambulatory Visit | Attending: Obstetrics & Gynecology | Admitting: Obstetrics & Gynecology

## 2011-05-10 DIAGNOSIS — N939 Abnormal uterine and vaginal bleeding, unspecified: Secondary | ICD-10-CM

## 2011-05-10 DIAGNOSIS — N949 Unspecified condition associated with female genital organs and menstrual cycle: Secondary | ICD-10-CM | POA: Insufficient documentation

## 2011-05-10 DIAGNOSIS — N898 Other specified noninflammatory disorders of vagina: Secondary | ICD-10-CM

## 2011-05-10 DIAGNOSIS — N938 Other specified abnormal uterine and vaginal bleeding: Secondary | ICD-10-CM | POA: Insufficient documentation

## 2011-05-11 ENCOUNTER — Encounter (HOSPITAL_COMMUNITY): Payer: Self-pay | Admitting: *Deleted

## 2011-05-11 ENCOUNTER — Inpatient Hospital Stay (HOSPITAL_COMMUNITY): Payer: Self-pay

## 2011-05-11 LAB — CBC
HCT: 41.9 % (ref 36.0–46.0)
Hemoglobin: 13.7 g/dL (ref 12.0–15.0)
MCHC: 32.7 g/dL (ref 30.0–36.0)

## 2011-05-11 LAB — WET PREP, GENITAL: Yeast Wet Prep HPF POC: NONE SEEN

## 2011-05-11 MED ORDER — PROMETHAZINE HCL 25 MG PO TABS: 25.0000 mg | ORAL_TABLET | Freq: Four times a day (QID) | ORAL | Status: DC | PRN

## 2011-05-11 MED ORDER — MISOPROSTOL 200 MCG PO TABS: ORAL_TABLET | ORAL | Status: DC

## 2011-05-11 MED ORDER — HYDROCODONE-ACETAMINOPHEN 5-500 MG PO TABS: 1.0000 | ORAL_TABLET | Freq: Four times a day (QID) | ORAL | Status: AC | PRN

## 2011-05-11 MED ORDER — KETOROLAC TROMETHAMINE 60 MG/2ML IM SOLN
60.0000 mg | Freq: Once | INTRAMUSCULAR | Status: AC
  Administered 2011-05-11: 60 mg via INTRAMUSCULAR
  Filled 2011-05-11: qty 2

## 2011-05-11 NOTE — ED Provider Notes (Signed)
History   Pt presents today c/o continued vag bleeding and abd pain. She states she had a missed AB in 9/12 and was supposed to have and D&C. However, she states she was unable to have the procedure because she had to work. She denies fever, vag irritation, or any other sx at this time.  Chief Complaint  Patient presents with  . Dysmenorrhea  . Vaginal Bleeding   HPI  OB History    Grav Para Term Preterm Abortions TAB SAB Ect Mult Living   7 4 4  0 2 1 1  0 0 4      Past Medical History  Diagnosis Date  . Depression     Past Surgical History  Procedure Date  . Appendectomy     No family history on file.  History  Substance Use Topics  . Smoking status: Former Games developer  . Smokeless tobacco: Not on file  . Alcohol Use: No    Allergies:  Allergies  Allergen Reactions  . Morphine Itching    Prescriptions prior to admission  Medication Sig Dispense Refill  . ibuprofen (ADVIL,MOTRIN) 600 MG tablet Take 600 mg by mouth as needed. For teeth pain       . Multiple Vitamins-Minerals (MULTIVITAMIN WITH MINERALS) tablet Take 1 tablet by mouth daily.        . prenatal vitamin w/FE, FA (PRENATAL 1 + 1) 27-1 MG TABS Take 1 tablet by mouth daily.        . promethazine (PHENERGAN) 25 MG tablet Take 25 mg by mouth every 6 (six) hours as needed.          Review of Systems  Constitutional: Negative for fever.  Respiratory: Negative for cough.   Cardiovascular: Negative for chest pain.  Gastrointestinal: Positive for abdominal pain. Negative for nausea, vomiting, diarrhea and constipation.  Genitourinary: Negative for dysuria, urgency, frequency and hematuria.  Neurological: Negative for dizziness and headaches.  Psychiatric/Behavioral: Negative for depression and suicidal ideas.   Physical Exam   Blood pressure 117/72, pulse 75, temperature 99.1 F (37.3 C), temperature source Oral, resp. rate 18, last menstrual period 03/02/2011, unknown if currently breastfeeding.  Physical  Exam  Nursing note and vitals reviewed. Constitutional: She is oriented to person, place, and time. She appears well-developed and well-nourished. No distress.  HENT:  Head: Normocephalic and atraumatic.  Eyes: EOM are normal. Pupils are equal, round, and reactive to light.  GI: Soft. She exhibits no distension and no mass. There is no tenderness. There is no rebound and no guarding.  Genitourinary: There is bleeding around the vagina. Vaginal discharge found.       Minimal amount of vag bleeding noted. Cervix Lg/closed.  Neurological: She is alert and oriented to person, place, and time.  Skin: Skin is warm and dry. She is not diaphoretic.  Psychiatric: She has a normal mood and affect. Her behavior is normal. Judgment and thought content normal.    MAU Course  Procedures  Results for orders placed during the hospital encounter of 05/10/11 (from the past 24 hour(s))  CBC     Status: Normal   Collection Time   05/11/11  1:22 AM      Component Value Range   WBC 6.8  4.0 - 10.5 (K/uL)   RBC 4.91  3.87 - 5.11 (MIL/uL)   Hemoglobin 13.7  12.0 - 15.0 (g/dL)   HCT 16.1  09.6 - 04.5 (%)   MCV 85.3  78.0 - 100.0 (fL)   MCH 27.9  26.0 -  34.0 (pg)   MCHC 32.7  30.0 - 36.0 (g/dL)   RDW 09.8  11.9 - 14.7 (%)   Platelets 195  150 - 400 (K/uL)  WET PREP, GENITAL     Status: Abnormal   Collection Time   05/11/11  2:00 AM      Component Value Range   Yeast, Wet Prep NONE SEEN  NONE SEEN    Trich, Wet Prep NONE SEEN  NONE SEEN    Clue Cells, Wet Prep FEW (*) NONE SEEN    WBC, Wet Prep HPF POC FEW (*) NONE SEEN   POCT PREGNANCY, URINE     Status: Normal   Collection Time   05/11/11  2:02 AM      Component Value Range   Preg Test, Ur NEGATIVE      US Transvaginal Non-ob  05/11/2011  *RADIOLOGY REPORT*  Clinical Data: Pelvic pain and vaginal bleeding since spontaneous abortion in September 2012.  TRANSVAGINAL ULTRASOUND OF PELVIS  Technique:  Transvaginal ultrasound examination of the  pelvis was performed including evaluation of the uterus, ovaries, adnexal regions, and pelvic cul-de-sac.  Comparison:  Pelvic ultrasound performed 03/20/2011  Findings:  Uterus:  Mildly enlarged, measuring 9.8 x 6.0 x 6.7 cm.  Myometrial echogenicity is within normal limits.  Endometrium: The endometrial echo complex is markedly thickened, measuring 3.9 cm, with multiple areas of decreased echogenicity and minimal associated blood flow. This has increased from 1.7 cm on the prior ultrasound.  Given the negative pregnancy test, this does not reflect a molar pregnancy; it could reflect endometrial carcinoma or possibly, given the recent spontaneous abortion, continued growth of retained products of conception.  Right ovary: Normal appearance/no adnexal mass; measures 2.7 x 1.7 x 2.1 cm.  Left ovary: Normal appearance/no adnexal mass; measures 3.7 x 2.3 x 2.7 cm.  A complex cystic structure with an apparent soft tissue focus within the left ovary, measuring 2.2 cm, may reflect a small hemorrhagic cyst.  Other Findings:  No free fluid seen within the pelvic cul-de-sac.  IMPRESSION:  1.  Progressively worsening focal thickening of the endometrial echo complex, with markedly heterogeneous echogenicity and minimal associated blood flow.  This measures 3.9 cm; given the negative pregnancy test, this does not reflect a molar pregnancy.  It could reflect endometrial carcinoma or possibly, given the recent spontaneous abortion, continued growth of retained products of conception. 2.  Complex cystic focus in the left ovary, with apparent associated soft tissue component, may reflect a small hemorrhagic cyst.  Original Report Authenticated By: Tonia Ghent, M.D.    Discussed pt with Dr. Despina Hidden. Will give Rx for cytotec to repeat in 8hrs if no result. If still no result then pt to return to MAU on Sunday for probable D&C. Assessment and Plan  DUB: pt likely with retained residual cast. Will try cytotec. Discussed diet,  activity, risks, and precautions. Pt to return on Sunday if sx do not resolve. Pt will also be given an Rx for phenergan and lortab.  Clinton Gallant. Rice III, DrHSc, MPAS, PA-C  05/11/2011, 2:11 AM   Henrietta Hoover, PA 05/11/11 [phone]

## 2011-05-11 NOTE — Progress Notes (Signed)
Pt states, " I had a miscarriage on September 1 st, and I'm still bleeding. They gave me the pills but they didn't work. I don't know if this is a period now or not. I just feel like my system is being poisoned.. I keep having cramping in my low abdomen and my low back."

## 2011-05-12 ENCOUNTER — Other Ambulatory Visit: Payer: Self-pay | Admitting: Obstetrics & Gynecology

## 2011-05-12 ENCOUNTER — Inpatient Hospital Stay (HOSPITAL_COMMUNITY): Payer: Medicaid Other | Admitting: Anesthesiology

## 2011-05-12 ENCOUNTER — Ambulatory Visit (HOSPITAL_COMMUNITY)
Admission: AD | Admit: 2011-05-12 | Discharge: 2011-05-12 | Disposition: A | Discharge status: Home / Self Care | Payer: Medicaid Other | Source: Ambulatory Visit | Attending: Obstetrics & Gynecology | Admitting: Obstetrics & Gynecology

## 2011-05-12 ENCOUNTER — Encounter (HOSPITAL_COMMUNITY)
Admission: AD | Disposition: A | Discharge status: Home / Self Care | Payer: Self-pay | Source: Ambulatory Visit | Attending: Obstetrics & Gynecology

## 2011-05-12 ENCOUNTER — Encounter (HOSPITAL_COMMUNITY): Payer: Self-pay | Admitting: Anesthesiology

## 2011-05-12 ENCOUNTER — Encounter (HOSPITAL_COMMUNITY): Payer: Self-pay | Admitting: Obstetrics and Gynecology

## 2011-05-12 DIAGNOSIS — Z9889 Other specified postprocedural states: Secondary | ICD-10-CM

## 2011-05-12 DIAGNOSIS — O034 Incomplete spontaneous abortion without complication: Principal | ICD-10-CM | POA: Insufficient documentation

## 2011-05-12 DIAGNOSIS — O021 Missed abortion: Secondary | ICD-10-CM

## 2011-05-12 HISTORY — PX: DILATION AND EVACUATION: SHX1459

## 2011-05-12 LAB — CBC
HCT: 40.9 % (ref 36.0–46.0)
Platelets: 190 10*3/uL (ref 150–400)
RDW: 13.5 % (ref 11.5–15.5)
WBC: 6.1 10*3/uL (ref 4.0–10.5)

## 2011-05-12 SURGERY — DILATION AND EVACUATION, UTERUS
Anesthesia: Monitor Anesthesia Care

## 2011-05-12 MED ORDER — SODIUM CHLORIDE 0.9 % IR SOLN
Status: DC | PRN
  Administered 2011-05-12: 1000 mL

## 2011-05-12 MED ORDER — KETOROLAC TROMETHAMINE 60 MG/2ML IM SOLN: INTRAMUSCULAR | Status: DC | PRN

## 2011-05-12 MED ORDER — LIDOCAINE HCL (CARDIAC) 20 MG/ML IV SOLN
INTRAVENOUS | Status: DC | PRN
  Administered 2011-05-12: 80 mg via INTRAVENOUS

## 2011-05-12 MED ORDER — KETOROLAC TROMETHAMINE 30 MG/ML IJ SOLN
INTRAMUSCULAR | Status: DC | PRN
  Administered 2011-05-12: 30 mg via INTRAVENOUS

## 2011-05-12 MED ORDER — LACTATED RINGERS IV SOLN
INTRAVENOUS | Status: DC
  Administered 2011-05-12: 09:00:00 via INTRAVENOUS

## 2011-05-12 MED ORDER — KETOROLAC TROMETHAMINE 10 MG PO TABS: 10.0000 mg | ORAL_TABLET | Freq: Three times a day (TID) | ORAL | Status: AC | PRN

## 2011-05-12 MED ORDER — ACETAMINOPHEN 325 MG PO TABS: 325.0000 mg | ORAL_TABLET | ORAL | Status: DC | PRN

## 2011-05-12 MED ORDER — FENTANYL CITRATE 0.05 MG/ML IJ SOLN
INTRAMUSCULAR | Status: AC
  Filled 2011-05-12: qty 2

## 2011-05-12 MED ORDER — KETOROLAC TROMETHAMINE 30 MG/ML IJ SOLN: 15.0000 mg | Freq: Once | INTRAMUSCULAR | Status: DC | PRN

## 2011-05-12 MED ORDER — PROPOFOL 10 MG/ML IV EMUL
INTRAVENOUS | Status: AC
  Filled 2011-05-12: qty 40

## 2011-05-12 MED ORDER — CEFAZOLIN SODIUM 1-5 GM-% IV SOLN
1.0000 g | INTRAVENOUS | Status: DC
  Administered 2011-05-12: 1 g via INTRAVENOUS

## 2011-05-12 MED ORDER — ONDANSETRON HCL 4 MG/2ML IJ SOLN
INTRAMUSCULAR | Status: DC | PRN
  Administered 2011-05-12: 4 mg via INTRAVENOUS

## 2011-05-12 MED ORDER — FENTANYL CITRATE 0.05 MG/ML IJ SOLN
INTRAMUSCULAR | Status: DC | PRN
  Administered 2011-05-12: 100 ug via INTRAVENOUS

## 2011-05-12 MED ORDER — DEXAMETHASONE SODIUM PHOSPHATE 10 MG/ML IJ SOLN
INTRAMUSCULAR | Status: DC | PRN
  Administered 2011-05-12: 10 mg via INTRAVENOUS

## 2011-05-12 MED ORDER — ONDANSETRON HCL 4 MG/2ML IJ SOLN
INTRAMUSCULAR | Status: AC
  Filled 2011-05-12: qty 2

## 2011-05-12 MED ORDER — LACTATED RINGERS IV SOLN
INTRAVENOUS | Status: DC | PRN
  Administered 2011-05-12: 10:00:00 via INTRAVENOUS

## 2011-05-12 MED ORDER — LACTATED RINGERS IV SOLN
INTRAVENOUS | Status: DC
  Administered 2011-05-12: 1000 mL via INTRAVENOUS

## 2011-05-12 MED ORDER — MIDAZOLAM HCL 5 MG/5ML IJ SOLN
INTRAMUSCULAR | Status: DC | PRN
  Administered 2011-05-12: 2 mg via INTRAVENOUS

## 2011-05-12 MED ORDER — PROMETHAZINE HCL 25 MG/ML IJ SOLN: 6.2500 mg | INTRAMUSCULAR | Status: DC | PRN

## 2011-05-12 MED ORDER — CITRIC ACID-SODIUM CITRATE 334-500 MG/5ML PO SOLN
30.0000 mL | Freq: Once | ORAL | Status: AC
  Administered 2011-05-12: 30 mL via ORAL
  Filled 2011-05-12: qty 15

## 2011-05-12 MED ORDER — LIDOCAINE HCL (CARDIAC) 20 MG/ML IV SOLN
INTRAVENOUS | Status: AC
  Filled 2011-05-12: qty 5

## 2011-05-12 MED ORDER — DEXAMETHASONE SODIUM PHOSPHATE 10 MG/ML IJ SOLN
INTRAMUSCULAR | Status: AC
  Filled 2011-05-12: qty 1

## 2011-05-12 MED ORDER — KETOROLAC TROMETHAMINE 60 MG/2ML IM SOLN
INTRAMUSCULAR | Status: AC
  Filled 2011-05-12: qty 2

## 2011-05-12 MED ORDER — BUPIVACAINE HCL 0.5 % IJ SOLN
INTRAMUSCULAR | Status: DC | PRN
  Administered 2011-05-12: 20 mL

## 2011-05-12 MED ORDER — FENTANYL CITRATE 0.05 MG/ML IJ SOLN
25.0000 ug | INTRAMUSCULAR | Status: DC | PRN
  Administered 2011-05-12: 50 ug via INTRAVENOUS

## 2011-05-12 MED ORDER — PROPOFOL 10 MG/ML IV EMUL
INTRAVENOUS | Status: DC | PRN
  Administered 2011-05-12: 10 mg via INTRAVENOUS
  Administered 2011-05-12 (×3): 20 mg via INTRAVENOUS
  Administered 2011-05-12: 30 mg via INTRAVENOUS
  Administered 2011-05-12: 20 mg via INTRAVENOUS

## 2011-05-12 MED ORDER — METHYLERGONOVINE MALEATE 0.2 MG PO TABS: 0.2000 mg | ORAL_TABLET | Freq: Four times a day (QID) | ORAL | Status: DC

## 2011-05-12 MED ORDER — MIDAZOLAM HCL 2 MG/2ML IJ SOLN
INTRAMUSCULAR | Status: AC
  Filled 2011-05-12: qty 2

## 2011-05-12 MED ORDER — FAMOTIDINE IN NACL 20-0.9 MG/50ML-% IV SOLN
20.0000 mg | Freq: Once | INTRAVENOUS | Status: AC
  Administered 2011-05-12: 20 mg via INTRAVENOUS
  Filled 2011-05-12: qty 50

## 2011-05-12 MED ORDER — CEFAZOLIN SODIUM 1-5 GM-% IV SOLN
INTRAVENOUS | Status: AC
  Administered 2011-05-12: 1 g via INTRAVENOUS
  Filled 2011-05-12: qty 50

## 2011-05-12 MED ORDER — METHYLERGONOVINE MALEATE 0.2 MG/ML IJ SOLN
INTRAMUSCULAR | Status: DC | PRN
  Administered 2011-05-12: 0.2 mg via INTRAMUSCULAR

## 2011-05-12 SURGICAL SUPPLY — 18 items
CATH ROBINSON RED A/P 16FR (CATHETERS) ×2 IMPLANT
CLOTH BEACON ORANGE TIMEOUT ST (SAFETY) ×2 IMPLANT
DECANTER SPIKE VIAL GLASS SM (MISCELLANEOUS) ×2 IMPLANT
GLOVE BIOGEL PI IND STRL 8 (GLOVE) ×2 IMPLANT
GLOVE BIOGEL PI INDICATOR 8 (GLOVE) ×2
GLOVE ECLIPSE 8.0 STRL XLNG CF (GLOVE) ×2 IMPLANT
KIT BERKELEY 1ST TRIMESTER 3/8 (MISCELLANEOUS) ×2 IMPLANT
NEEDLE SPNL 22GX3.5 QUINCKE BK (NEEDLE) ×2 IMPLANT
NS IRRIG 1000ML POUR BTL (IV SOLUTION) ×2 IMPLANT
PACK VAGINAL MINOR WOMEN LF (CUSTOM PROCEDURE TRAY) ×2 IMPLANT
PAD PREP 24X48 CUFFED NSTRL (MISCELLANEOUS) ×2 IMPLANT
SET BERKELEY SUCTION TUBING (SUCTIONS) ×2 IMPLANT
SYR CONTROL 10ML LL (SYRINGE) ×2 IMPLANT
TOWEL OR 17X24 6PK STRL BLUE (TOWEL DISPOSABLE) ×4 IMPLANT
VACURETTE 10 RIGID CVD (CANNULA) IMPLANT
VACURETTE 7MM CVD STRL WRAP (CANNULA) IMPLANT
VACURETTE 8 RIGID CVD (CANNULA) IMPLANT
VACURETTE 9 RIGID CVD (CANNULA) IMPLANT

## 2011-05-12 NOTE — Anesthesia Preprocedure Evaluation (Signed)
Anesthesia Evaluation  Patient identified by MRN, date of birth, ID band Patient awake    Reviewed: Allergy & Precautions, H&P , Patient's Chart, lab work & pertinent test results, reviewed documented beta blocker date and time   History of Anesthesia Complications Negative for: history of anesthetic complications  Airway Mallampati: II TM Distance: >3 FB Neck ROM: full    Dental No notable dental hx.    Pulmonary neg pulmonary ROS,  clear to auscultation  Pulmonary exam normal       Cardiovascular Exercise Tolerance: Good neg cardio ROS regular Normal    Neuro/Psych PSYCHIATRIC DISORDERS Negative Neurological ROS  Negative Psych ROS   GI/Hepatic negative GI ROS, Neg liver ROS,   Endo/Other  Negative Endocrine ROS  Renal/GU negative Renal ROS     Musculoskeletal   Abdominal   Peds  Hematology negative hematology ROS (+)   Anesthesia Other Findings 8 week loss  Reproductive/Obstetrics negative OB ROS                           Anesthesia Physical Anesthesia Plan  ASA: II  Anesthesia Plan: MAC   Post-op Pain Management:    Induction:   Airway Management Planned:   Additional Equipment:   Intra-op Plan:   Post-operative Plan:   Informed Consent: I have reviewed the patients History and Physical, chart, labs and discussed the procedure including the risks, benefits and alternatives for the proposed anesthesia with the patient or authorized representative who has indicated his/her understanding and acceptance.   Dental Advisory Given  Plan Discussed with: CRNA and Surgeon  Anesthesia Plan Comments:         Anesthesia Quick Evaluation

## 2011-05-12 NOTE — H&P (Signed)
Preoperative History and Physical  [name] is a 40 y.o. [id number] with a pregnancy loss in  September of a first trimester loss who has continued to have daily bleeding since that time.  Sonogram 2 days ago reveals a retained decidual cast.  We tried outpatient cytotec with no success and she is here today as instructed NPO for D&C,    PMH:    Past Medical History  Diagnosis Date  . Depression     PSH:     Past Surgical History  Procedure Date  . Appendectomy     POb/GynH:      OB History    Grav Para Term Preterm Abortions TAB SAB Ect Mult Living   8 4 4  0 2 1 1  0 0 4      SH:   History  Substance Use Topics  . Smoking status: Former Games developer  . Smokeless tobacco: Not on file  . Alcohol Use: No    FH:   hypertension   Allergies:  Allergies  Allergen Reactions  . Morphine Itching    Medications:      Current facility-administered medications:citric acid-sodium citrate (ORACIT) solution 30 mL, 30 mL, Oral, Once, Velna Hatchet, MD;  famotidine (PEPCID) IVPB 20 mg, 20 mg, Intravenous, Once, Velna Hatchet, MD, 20 mg at 05/12/11 1191  Review of Systems:   Review of Systems  Constitutional: Negative for fever, chills, weight loss, malaise/fatigue and diaphoresis.  HENT: Negative for hearing loss, ear pain, nosebleeds, congestion, sore throat, neck pain, tinnitus and ear discharge.   Eyes: Negative for blurred vision, double vision, photophobia, pain, discharge and redness.  Respiratory: Negative for cough, hemoptysis, sputum production, shortness of breath, wheezing and stridor.   Cardiovascular: Negative for chest pain, palpitations, orthopnea, claudication, leg swelling and PND.  Gastrointestinal: Negative for abdominal pain. Negative for heartburn, nausea, vomiting, diarrhea, constipation, blood in stool and melena.  Genitourinary: Negative for dysuria, urgency, frequency, hematuria and flank pain.  Positive for bleeding  Musculoskeletal: Negative for  myalgias, back pain, joint pain and falls.  Skin: Negative for itching and rash.  Neurological: Negative for dizziness, tingling, tremors, sensory change, speech change, focal weakness, seizures, loss of consciousness, weakness and headaches.  Endo/Heme/Allergies: Negative for environmental allergies and polydipsia. Does not bruise/bleed easily.  Psychiatric/Behavioral: Negative for depression, suicidal ideas, hallucinations, memory loss and substance abuse. The patient is not nervous/anxious and does not have insomnia.      PHYSICAL EXAM:  Blood pressure 111/67, pulse 76, temperature 99.1 F (37.3 C), temperature source Oral, resp. rate 16, height 5\' 4"  (1.626 m), weight 188 lb (85.276 kg), last menstrual period 11/25/2010, unknown if currently breastfeeding.  Physical Exam  Vitals reviewed. Constitutional: She is oriented to person, place, and time. She appears well-developed and well-nourished.  HENT:  Head: Normocephalic and atraumatic.  Right Ear: External ear normal.  Left Ear: External ear normal.  Nose: Nose normal.  Mouth/Throat: Oropharynx is clear and moist.  Eyes: Conjunctivae and EOM are normal. Pupils are equal, round, and reactive to light. Right eye exhibits no discharge. Left eye exhibits no discharge. No scleral icterus.  Neck: Normal range of motion. Neck supple. No tracheal deviation present. No thyromegaly present.  Cardiovascular: Normal rate, regular rhythm, normal heart sounds and intact distal pulses.  Exam reveals no gallop and no friction rub.   No murmur heard. Respiratory: Effort normal and breath sounds normal. No respiratory distress. She has no wheezes. She has no rales. She exhibits no tenderness.  GI: Soft. Bowel sounds are normal. She exhibits no distension and no mass. There is no  tenderness. There is no rebound and no guarding.  Genitourinary:   Vulva is normal without lesions Vagina is pink moist without discharge Cervix normal in appearance and  pap is normal Uterus is normal sized shape and contour Adnexa is negative with normal sized ovaries by sonogram  Musculoskeletal: Normal range of motion. She exhibits no edema and no tenderness.  Neurological: She is alert and oriented to person, place, and time. She has normal reflexes. She displays normal reflexes. No cranial nerve deficit. She exhibits normal muscle tone. Coordination normal.  Skin: Skin is warm and dry. No rash noted. No erythema. No pallor.  Psychiatric: She has a normal mood and affect. Her behavior is normal. Judgment and thought content normal.    Labs: Recent Results (from the past 336 hour(s))  CBC   Collection Time   2011/06/03  1:22 AM      Component Value Range   WBC 6.8  4.0 - 10.5 (K/uL)   RBC 4.91  3.87 - 5.11 (MIL/uL)   Hemoglobin 13.7  12.0 - 15.0 (g/dL)   HCT 16.1  09.6 - 04.5 (%)   MCV 85.3  78.0 - 100.0 (fL)   MCH 27.9  26.0 - 34.0 (pg)   MCHC 32.7  30.0 - 36.0 (g/dL)   RDW 40.9  81.1 - 91.4 (%)   Platelets 195  150 - 400 (K/uL)  WET PREP, GENITAL   Collection Time   06-03-11  2:00 AM      Component Value Range   Yeast, Wet Prep NONE SEEN  NONE SEEN    Trich, Wet Prep NONE SEEN  NONE SEEN    Clue Cells, Wet Prep FEW (*) NONE SEEN    WBC, Wet Prep HPF POC FEW (*) NONE SEEN   POCT PREGNANCY, URINE   Collection Time   06/03/11  2:02 AM      Component Value Range   Preg Test, Ur NEGATIVE      EKG:   Imaging Studies: US Transvaginal Non-ob  06-03-2011  *RADIOLOGY REPORT*  Clinical Data: Pelvic pain and vaginal bleeding since spontaneous abortion in September 2012.  TRANSVAGINAL ULTRASOUND OF PELVIS  Technique:  Transvaginal ultrasound examination of the pelvis was performed including evaluation of the uterus, ovaries, adnexal regions, and pelvic cul-de-sac.  Comparison:  Pelvic ultrasound performed 03/20/2011  Findings:  Uterus:  Mildly enlarged, measuring 9.8 x 6.0 x 6.7 cm.  Myometrial echogenicity is within normal limits.  Endometrium:  The endometrial echo complex is markedly thickened, measuring 3.9 cm, with multiple areas of decreased echogenicity and minimal associated blood flow. This has increased from 1.7 cm on the prior ultrasound.  Given the negative pregnancy test, this does not reflect a molar pregnancy; it could reflect endometrial carcinoma or possibly, given the recent spontaneous abortion, continued growth of retained products of conception.  Right ovary: Normal appearance/no adnexal mass; measures 2.7 x 1.7 x 2.1 cm.  Left ovary: Normal appearance/no adnexal mass; measures 3.7 x 2.3 x 2.7 cm.  A complex cystic structure with an apparent soft tissue focus within the left ovary, measuring 2.2 cm, may reflect a small hemorrhagic cyst.  Other Findings:  No free fluid seen within the pelvic cul-de-sac.  IMPRESSION:  1.  Progressively worsening focal thickening of the endometrial echo complex, with markedly heterogeneous echogenicity and minimal associated blood flow.  This measures 3.9 cm; given the negative pregnancy test, this does  not reflect a molar pregnancy.  It could reflect endometrial carcinoma or possibly, given the recent spontaneous abortion, continued growth of retained products of conception. 2.  Complex cystic focus in the left ovary, with apparent associated soft tissue component, may reflect a small hemorrhagic cyst.  Original Report Authenticated By: Tonia Ghent, M.D.      Assessment: Incomplete spontaneous pregnancy loss in the first trimester  Plan: Uterine evacuation  Willow Shidler H

## 2011-05-12 NOTE — Progress Notes (Signed)
Has tried Cytotec x 2 did not work missed AB, still having vaginal bleeding, last solids and fluids 11/10 1145 p.m.

## 2011-05-12 NOTE — Transfer of Care (Signed)
Immediate Anesthesia Transfer of Care Note  Patient: [name]  Procedure(s) Performed:  DILATATION AND EVACUATION (D&E)  Patient Location: PACU  Anesthesia Type: MAC  Level of Consciousness: awake, alert  and oriented  Airway & Oxygen Therapy: Patient Spontanous Breathing  Post-op Assessment: Report given to PACU RN and Post -op Vital signs reviewed and stable  Post vital signs: Reviewed and stable  Complications: No apparent anesthesia complications

## 2011-05-12 NOTE — Op Note (Signed)
Preoperative diagnosis:  1.  Incomplete pregnancy loss in the first trimester                                         2.  retained decidual cast and placenta from pregnancy loss over one month ago  Postoperative diagnosis:  Same as above  Procedure:  Cervical dilation with sharp and suction uterine curettage  Surgeon:  Rockne Coons MD  Anesthesia:  Mask airway with paracervical block placed by me  Findings:  The patient suffered a first trimester pregnancy lost over one month ago spontaneously.  She has continued to bleed since that time.  The patient presented to the [facility] admissions unit 2 days ago for evaluation and was found to have tissue remaining in the uterus.   Pregnancy test was negative.  As a result of failed the patient had retained decidual cast and/or placental tissue.  She was given Cytotec x2 doses to get her in width with the full understanding that it was very likely to be unsuccessful and to remain without anything to eat or drink after midnight last night and reprocess for probable uterine evacuation.  Indeed the patient had no bleeding of significance the past of tissue and presented this morning for a uterine curettage.  Description of operation:  Patient was taken to the operating room and placed in the supine position where she underwent mask airway anesthesia along with IV sedation.  She was then placed in the dorsal lithotomy position where she was prepped and draped in the usual sterile fashion.  A paracervical block using quarter percent Marcaine plain was placed, 10 cc on either side for 20 cc total.  The cervix was dilated to allow passage of a 8 French curved suction curette.  Multiple passes were made and the tissue was found to be densely adherent to the endometrium.  I had to aggressively curette as well in order to dislodge the material and do multiple aggressive passes with the suction curette as well.  Ineffective the decidual and placental tissue had become  calcified and densely adherent probably from post pregnancy loss calcification in normal inflammatory changes.  All the tissue was returned as in the pathology for evaluation.  Good uterine crie was obtained in all areas.  The patient received 1 g of Ancef prophylactically and 30 mg of Toradol as well.  She was awakened from her anesthesia taken to the recovery room in good stable condition were all counts were correct.  The blood loss for the procedure was minimal. She will be discharged to home and given a prescription for Toradol and Methergine. She is encouraged to be followed up in 2 weeks And to consider her birth control method at that time.  EURE,LUTHER H 05/12/2011 10:37 AM

## 2011-05-12 NOTE — ED Provider Notes (Signed)
History     Chief Complaint  Patient presents with  . Vaginal Bleeding   HPIRachel M Beal40 y.o.presents with continued vaginal bleeding with known missed abortion and failure to pass products of conception after 2 doses of Cytotech.  Is here for D&C.  Reports light-moderate vaginal bleeding without clots.  Reports cramping rating it 5/10.  Only other sxs itching throat, denies fever or difficulty swallowing. Patient is stable, waiting procedure.     Past Medical History  Diagnosis Date  . Depression     Past Surgical History  Procedure Date  . Appendectomy     No family history on file.  History  Substance Use Topics  . Smoking status: Former Games developer  . Smokeless tobacco: Not on file  . Alcohol Use: No    Allergies:  Allergies  Allergen Reactions  . Morphine Itching    Prescriptions prior to admission  Medication Sig Dispense Refill  . HYDROcodone-acetaminophen (VICODIN) 5-500 MG per tablet Take 1 tablet by mouth every 6 (six) hours as needed for pain.  30 tablet  0  . misoprostol (CYTOTEC) 200 MCG tablet Take all four tabs by mouth as a single dose.  4 tablet  1  . promethazine (PHENERGAN) 25 MG tablet Take 1 tablet (25 mg total) by mouth every 6 (six) hours as needed for nausea.  30 tablet  0    ROS Physical Exam   Blood pressure 111/67, pulse 76, temperature 99.1 F (37.3 C), temperature source Oral, resp. rate 16, height 5\' 4"  (1.626 m), weight 188 lb (85.276 kg), last menstrual period 03/02/2011, unknown if currently breastfeeding.  Physical Exam  MAU Course  Procedures  MDM  Care turned over to Dr. Despina Hidden  Assessment and Plan    [name],EVE M 05/12/2011, 8:27 AM   Matt Holmes, NP 05/12/11 [phone]

## 2011-05-12 NOTE — Anesthesia Postprocedure Evaluation (Signed)
Anesthesia Post Note  Patient: [name]  Procedure(s) Performed:  DILATATION AND EVACUATION (D&E)  Anesthesia type: MAC  Patient location: PACU  Post pain: Pain level controlled  Post assessment: Post-op Vital signs reviewed  Last Vitals:  Filed Vitals:   05/12/11 1100  BP: 112/66  Pulse: 57  Temp:   Resp: 33    Post vital signs: Reviewed  Level of consciousness: sedated  Complications: No apparent anesthesia complications

## 2011-05-14 ENCOUNTER — Encounter (HOSPITAL_COMMUNITY): Payer: Self-pay | Admitting: Obstetrics & Gynecology

## 2011-06-01 DEATH — deceased

## 2011-06-28 ENCOUNTER — Encounter: Payer: Self-pay | Admitting: Obstetrics & Gynecology

## 2011-07-25 ENCOUNTER — Encounter (HOSPITAL_COMMUNITY): Payer: Self-pay | Admitting: *Deleted

## 2011-07-25 ENCOUNTER — Emergency Department (INDEPENDENT_AMBULATORY_CARE_PROVIDER_SITE_OTHER)
Admission: EM | Admit: 2011-07-25 | Discharge: 2011-07-25 | Disposition: A | Discharge status: Home / Self Care | Payer: Self-pay | Source: Home / Self Care | Attending: Family Medicine | Admitting: Family Medicine

## 2011-07-25 DIAGNOSIS — K047 Periapical abscess without sinus: Secondary | ICD-10-CM

## 2011-07-25 MED ORDER — AMOXICILLIN 500 MG PO CAPS: 500.0000 mg | ORAL_CAPSULE | Freq: Three times a day (TID) | ORAL | Status: AC

## 2011-07-25 MED ORDER — HYDROCODONE-ACETAMINOPHEN 5-325 MG PO TABS: ORAL_TABLET | ORAL | Status: DC

## 2011-07-25 NOTE — ED Notes (Signed)
Mandibular molar pain x 3 weeks - c/o severe pain and states "I can taste the poison" / states ibuprofen and aleeve not working anymore

## 2011-07-25 NOTE — ED Provider Notes (Signed)
History     CSN: [id number]  Arrival date & time 07/25/11  1831   First MD Initiated Contact with Patient 07/25/11 1929      Chief Complaint  Patient presents with  . Dental Pain    (Consider location/radiation/quality/duration/timing/severity/associated sxs/prior treatment) HPI Comments: Patient reports having a sever tooth ache that started 3 wks ago. Getting worse. No fever. States she can taste the pus in her mouth. Has not contacted a dentist as of yet. No fever. Taking naprosyn with minimal relief   The history is provided by the patient.    Past Medical History  Diagnosis Date  . Depression     Past Surgical History  Procedure Date  . Appendectomy   . Dilation and evacuation 05/12/2011    Procedure: DILATATION AND EVACUATION (D&E);  Surgeon: Lazaro Arms, MD;  Location: [facility];  Service: Gynecology;  Laterality: N/A;    Family History  Problem Relation Age of Onset  . Diabetes Mother   . Asthma Mother     History  Substance Use Topics  . Smoking status: Former Games developer  . Smokeless tobacco: Not on file  . Alcohol Use: No    OB History    Grav Para Term Preterm Abortions TAB SAB Ect Mult Living   8 4 4  0 2 1 1  0 0 4      Review of Systems  Constitutional: Negative.   HENT: Negative.   Respiratory: Negative.   Cardiovascular: Negative.     Allergies  Morphine  Home Medications   Current Outpatient Rx  Name Route Sig Dispense Refill  . NAPROXEN 250 MG PO TABS Oral Take 250 mg by mouth 2 (two) times daily with a meal.    . AMOXICILLIN 500 MG PO CAPS Oral Take 1 capsule (500 mg total) by mouth 3 (three) times daily. 30 capsule 0  . HYDROCODONE-ACETAMINOPHEN 5-325 MG PO TABS  1-2 tabs q 8 hrs prn pain 15 tablet 0  . METHYLERGONOVINE MALEATE 0.2 MG PO TABS Oral Take 1 tablet (0.2 mg total) by mouth every 6 (six) hours. 6 tablet 0    BP 116/77  Pulse 60  Temp(Src) 98.2 F (36.8 C) (Oral)  Resp 18  SpO2 98%  LMP 07/05/2011  Physical Exam    Constitutional: She appears well-developed and well-nourished.  HENT:       Left lower most post molar decayed . Foul smelling discharge  Neck: Normal range of motion. Neck supple.  Cardiovascular: Normal rate and regular rhythm.   Pulmonary/Chest: Effort normal and breath sounds normal.  Skin: Skin is warm and dry.    ED Course  Procedures (including critical care time)  Labs Reviewed - No data to display No results found.   1. Dental abscess       MDM          Randa Spike, MD 07/25/11 2020

## 2011-11-22 ENCOUNTER — Emergency Department (HOSPITAL_COMMUNITY): Payer: Self-pay

## 2011-11-22 ENCOUNTER — Encounter (HOSPITAL_COMMUNITY): Payer: Self-pay | Admitting: Physical Medicine and Rehabilitation

## 2011-11-22 ENCOUNTER — Emergency Department (HOSPITAL_COMMUNITY)
Admission: EM | Admit: 2011-11-22 | Discharge: 2011-11-22 | Disposition: A | Discharge status: Home / Self Care | Payer: Self-pay | Attending: Emergency Medicine | Admitting: Emergency Medicine

## 2011-11-22 DIAGNOSIS — R0602 Shortness of breath: Secondary | ICD-10-CM | POA: Insufficient documentation

## 2011-11-22 DIAGNOSIS — R071 Chest pain on breathing: Secondary | ICD-10-CM | POA: Insufficient documentation

## 2011-11-22 DIAGNOSIS — M546 Pain in thoracic spine: Secondary | ICD-10-CM | POA: Insufficient documentation

## 2011-11-22 DIAGNOSIS — F3289 Other specified depressive episodes: Secondary | ICD-10-CM | POA: Insufficient documentation

## 2011-11-22 DIAGNOSIS — F329 Major depressive disorder, single episode, unspecified: Secondary | ICD-10-CM | POA: Insufficient documentation

## 2011-11-22 DIAGNOSIS — R0789 Other chest pain: Secondary | ICD-10-CM

## 2011-11-22 LAB — COMPREHENSIVE METABOLIC PANEL
AST: 18 U/L (ref 0–37)
Albumin: 3.7 g/dL (ref 3.5–5.2)
Alkaline Phosphatase: 67 U/L (ref 39–117)
CO2: 24 mEq/L (ref 19–32)
Chloride: 106 mEq/L (ref 96–112)
Creatinine, Ser: 0.76 mg/dL (ref 0.50–1.10)
GFR calc non Af Amer: >90 mL/min (ref >90)
Potassium: 4 mEq/L (ref 3.5–5.1)
Total Bilirubin: 0.2 mg/dL — ABNORMAL LOW (ref 0.3–1.2)

## 2011-11-22 LAB — CBC: MCV: 85.2 fL (ref 78.0–100.0)

## 2011-11-22 LAB — D-DIMER, QUANTITATIVE: D-Dimer, Quant: <0.22 ug/mL-FEU (ref 0.00–0.48)

## 2011-11-22 LAB — POCT I-STAT TROPONIN I

## 2011-11-22 MED ORDER — DIAZEPAM 5 MG PO TABS: 5.0000 mg | ORAL_TABLET | Freq: Four times a day (QID) | ORAL | Status: AC | PRN

## 2011-11-22 MED ORDER — HYDROCODONE-ACETAMINOPHEN 5-325 MG PO TABS
1.0000 | ORAL_TABLET | Freq: Once | ORAL | Status: AC
  Administered 2011-11-22: 1 via ORAL
  Filled 2011-11-22: qty 1

## 2011-11-22 MED ORDER — DIAZEPAM 5 MG PO TABS
5.0000 mg | ORAL_TABLET | Freq: Once | ORAL | Status: AC
  Administered 2011-11-22: 5 mg via ORAL
  Filled 2011-11-22: qty 1

## 2011-11-22 MED ORDER — HYDROCODONE-ACETAMINOPHEN 5-325 MG PO TABS: 1.0000 | ORAL_TABLET | ORAL | Status: AC | PRN

## 2011-11-22 NOTE — ED Provider Notes (Signed)
History     CSN: [id number]  Arrival date & time 11/22/11  1724   First MD Initiated Contact with Patient 11/22/11 2015      Chief Complaint  Patient presents with  . Chest Pain  . Shortness of Breath    (Consider location/radiation/quality/duration/timing/severity/associated sxs/prior treatment) HPI Comments: Patient here with acute onset of chest pain - she states that she awoke this morning at 0400 with chest pain - she states that it feels like a band around her chest into her back - she states that this is associated with shortness of breath.  She states that she got up this morning, went on about her day and then returned home to took a nap, she states that when she awoke again, she thought the pain would be gone but it was not so she came here - she reports pain worse with palpation of the chest and deep inspiration.  She states that she has had similar pain in the past and it was related to anxiety.  She denies exogenous estrogen, smoking, cough, congestion, fever, chills, early heart disease.  Patient is a 41 y.o. female presenting with chest pain and shortness of breath. The history is provided by the patient. No language interpreter was used.  Chest Pain The chest pain began yesterday. Chest pain occurs constantly. The chest pain is unchanged. The pain is associated with breathing. The quality of the pain is described as sharp, pressure-like, squeezing and pleuritic. The pain radiates to the mid back. Chest pain is worsened by certain positions and deep breathing. Primary symptoms include shortness of breath. Pertinent negatives for primary symptoms include no fever, no fatigue, no syncope, no cough, no wheezing, no palpitations, no abdominal pain, no nausea, no vomiting, no dizziness and no altered mental status.  Pertinent negatives for associated symptoms include no claudication, no diaphoresis, no lower extremity edema, no near-syncope, no numbness, no orthopnea, no paroxysmal  nocturnal dyspnea and no weakness. She tried nothing for the symptoms. Risk factors include no known risk factors.  Her past medical history is significant for anxiety/panic attacks.    Shortness of Breath  Associated symptoms include chest pain and shortness of breath. Pertinent negatives include no orthopnea, no fever, no cough and no wheezing.    Past Medical History  Diagnosis Date  . Depression     Past Surgical History  Procedure Date  . Appendectomy   . Dilation and evacuation 05/12/2011    Procedure: DILATATION AND EVACUATION (D&E);  Surgeon: Lazaro Arms, MD;  Location: [facility];  Service: Gynecology;  Laterality: N/A;    Family History  Problem Relation Age of Onset  . Diabetes Mother   . Asthma Mother     History  Substance Use Topics  . Smoking status: Former Games developer  . Smokeless tobacco: Not on file  . Alcohol Use: No    OB History    Grav Para Term Preterm Abortions TAB SAB Ect Mult Living   8 4 4  0 2 1 1  0 0 4      Review of Systems  Constitutional: Negative for fever, diaphoresis and fatigue.  Respiratory: Positive for shortness of breath. Negative for cough and wheezing.   Cardiovascular: Positive for chest pain. Negative for palpitations, orthopnea, claudication, syncope and near-syncope.  Gastrointestinal: Negative for nausea, vomiting and abdominal pain.  Neurological: Negative for dizziness, weakness and numbness.  Psychiatric/Behavioral: Negative for altered mental status.  All other systems reviewed and are negative.    Allergies  Morphine  Home Medications  No current outpatient prescriptions on file.  BP 103/68  Pulse 67  Temp(Src) 98.5 F (36.9 C) (Oral)  Resp 16  SpO2 98%  LMP 11/25/2010  Physical Exam  Nursing note and vitals reviewed. Constitutional: She is oriented to person, place, and time. She appears well-developed and well-nourished. No distress.  HENT:  Head: Normocephalic and atraumatic.  Right Ear: External ear  normal.  Left Ear: External ear normal.  Nose: Nose normal.  Mouth/Throat: Oropharynx is clear and moist. No oropharyngeal exudate.  Eyes: Conjunctivae are normal. Pupils are equal, round, and reactive to light. No scleral icterus.  Neck: Normal range of motion. Neck supple.  Cardiovascular: Normal rate, regular rhythm and normal heart sounds.  Exam reveals no gallop and no friction rub.   No murmur heard. Pulmonary/Chest: Effort normal and breath sounds normal. No respiratory distress. She has no wheezes. She has no rales. She exhibits tenderness.       Chest with tenderness to palpation diffusely without crepitus  Abdominal: Soft. Bowel sounds are normal. She exhibits no distension and no mass. There is no tenderness. There is no rebound and no guarding.  Musculoskeletal:       Thoracic back: She exhibits tenderness, bony tenderness and pain. She exhibits normal range of motion.       Back:  Lymphadenopathy:    She has no cervical adenopathy.  Neurological: She is alert and oriented to person, place, and time. No cranial nerve deficit.  Skin: Skin is warm and dry. No rash noted. No erythema. No pallor.  Psychiatric: She has a normal mood and affect. Her behavior is normal. Judgment and thought content normal.    ED Course  Procedures (including critical care time)  Labs Reviewed  COMPREHENSIVE METABOLIC PANEL - Abnormal; Notable for the following:    Total Bilirubin 0.2 (*)    All other components within normal limits  CBC - Abnormal; Notable for the following:    RBC 5.13 (*)    All other components within normal limits  POCT I-STAT TROPONIN I  D-DIMER, QUANTITATIVE   Dg Chest 2 View  11/22/2011  *RADIOLOGY REPORT*  Clinical Data: Mid chest pain.  CHEST - 2 VIEW  Comparison: 10/29/2008  Findings: Two views of the chest demonstrate clear lungs.  Stable appearance of the heart and mediastinum.  Heart size is within normal limits.  No evidence for a pneumothorax.  Bony thorax is  intact.  IMPRESSION: No acute chest findings.  Original Report Authenticated By: Richarda Overlie, M.D.   Results for orders placed during the hospital encounter of 11/22/11  COMPREHENSIVE METABOLIC PANEL      Component Value Range   Sodium 140  135 - 145 (mEq/L)   Potassium 4.0  3.5 - 5.1 (mEq/L)   Chloride 106  96 - 112 (mEq/L)   CO2 24  19 - 32 (mEq/L)   Glucose, Bld 98  70 - 99 (mg/dL)   BUN 22  6 - 23 (mg/dL)   Creatinine, Ser 9.62  0.50 - 1.10 (mg/dL)   Calcium 9.5  8.4 - 95.2 (mg/dL)   Total Protein 7.3  6.0 - 8.3 (g/dL)   Albumin 3.7  3.5 - 5.2 (g/dL)   AST 18  0 - 37 (U/L)   ALT 24  0 - 35 (U/L)   Alkaline Phosphatase 67  39 - 117 (U/L)   Total Bilirubin 0.2 (*) 0.3 - 1.2 (mg/dL)   GFR calc non Af Amer >90  >90 (  mL/min)   GFR calc Af Amer >90  >90 (mL/min)  CBC      Component Value Range   WBC 7.1  4.0 - 10.5 (K/uL)   RBC 5.13 (*) 3.87 - 5.11 (MIL/uL)   Hemoglobin 14.5  12.0 - 15.0 (g/dL)   HCT 40.9  81.1 - 91.4 (%)   MCV 85.2  78.0 - 100.0 (fL)   MCH 28.3  26.0 - 34.0 (pg)   MCHC 33.2  30.0 - 36.0 (g/dL)   RDW 78.2  95.6 - 21.3 (%)   Platelets 200  150 - 400 (K/uL)  POCT I-STAT TROPONIN I      Component Value Range   Troponin i, poc 0.00  0.00 - 0.08 (ng/mL)   Comment 3           D-DIMER, QUANTITATIVE      Component Value Range   D-Dimer, Quant <0.22  0.00 - 0.48 (ug/mL-FEU)   Dg Chest 2 View  11/22/2011  *RADIOLOGY REPORT*  Clinical Data: Mid chest pain.  CHEST - 2 VIEW  Comparison: 10/29/2008  Findings: Two views of the chest demonstrate clear lungs.  Stable appearance of the heart and mediastinum.  Heart size is within normal limits.  No evidence for a pneumothorax.  Bony thorax is intact.  IMPRESSION: No acute chest findings.  Original Report Authenticated By: Richarda Overlie, M.D.       Date: 11/22/2011  Rate: 70  Rhythm: normal sinus rhythm  QRS Axis: normal  Intervals: normal  ST/T Wave abnormalities: normal  Conduction Disutrbances:none  Narrative  Interpretation: Reviewed by Dr. Ruthy Dick  Old EKG Reviewed: unchanged    Chest wall pain    MDM  Patient here with chest pain worse with palpation and movement, labs normal and re-assuring, EKG normal, CXR normal, patient reports improvement in pain after medication - will send home with short course of pain control.        Izola Price [place], [place] 11/22/11 2210

## 2011-11-22 NOTE — ED Notes (Signed)
Pt requesting to go home.  Notified of neg test results so far, but encouraged to see PA to come on at change of shift.  Pt agreed to stay.  Pt states she has been under a lot of stress lately and does have hx of panic attacks.  Pt has not travelled by plane or car in recent weeks.  Pain increases on inspiration.

## 2011-11-22 NOTE — ED Provider Notes (Signed)
Medical screening examination/treatment/procedure(s) were performed by non-physician practitioner and as supervising physician I was immediately available for consultation/collaboration.   Gerhard Munch, MD 11/22/11 [phone]

## 2011-11-22 NOTE — ED Notes (Signed)
Pt placed on cardiac monitor.  Pt in NSR.

## 2011-11-22 NOTE — ED Notes (Signed)
Called lab to find out why d-dimer has not resulted.  They stated it is in process.  Will call back in 15 minutes if no results have been posted.

## 2011-11-22 NOTE — ED Notes (Signed)
Pt presents to department for evaluation of diffuse chest pain and SOB. Onset this morning at home. 9/10 pain upon arrival. States pressure becomes worse with deep breathing and movement. Respirations unlabored. Skin warm and dry. She is alert and oriented x4.

## 2011-11-22 NOTE — Discharge Instructions (Signed)
Chest Wall Pain  Chest wall pain is pain in or around the bones and muscles of your chest. It may take up to 6 weeks to get better. It may take longer if you must stay physically active in your work and activities.   CAUSES   Chest wall pain may happen on its own. However, it may be caused by:   A viral illness like the flu.   Injury.   Coughing.   Exercise.   Arthritis.   Fibromyalgia.   Shingles.  HOME CARE INSTRUCTIONS    Avoid overtiring physical activity. Try not to strain or perform activities that cause pain. This includes any activities using your chest or your abdominal and side muscles, especially if heavy weights are used.   Put ice on the sore area.   Put ice in a plastic bag.   Place a towel between your skin and the bag.   Leave the ice on for 15 to 20 minutes per hour while awake for the first 2 days.   Only take over-the-counter or prescription medicines for pain, discomfort, or fever as directed by your caregiver.  SEEK IMMEDIATE MEDICAL CARE IF:    Your pain increases, or you are very uncomfortable.   You have a fever.   Your chest pain becomes worse.   You have new, unexplained symptoms.   You have nausea or vomiting.   You feel sweaty or lightheaded.   You have a cough with phlegm (sputum), or you cough up blood.  MAKE SURE YOU:    Understand these instructions.   Will watch your condition.   Will get help right away if you are not doing well or get worse.  Document Released: 06/17/2005 Document Revised: 06/06/2011 Document Reviewed: 02/11/2011  [employer] Patient Information 2012 [employer]    Costochondritis  Costochondritis (Tietze syndrome), or costochondral separation, is a swelling and irritation (inflammation) of the tissue (cartilage) that connects your ribs with your breastbone (sternum). It may occur on its own (spontaneously), through damage caused by an accident (trauma), or simply from coughing or minor exercise. It may take up to 6 weeks to get better and  longer if you are unable to be conservative in your activities.  HOME CARE INSTRUCTIONS    Avoid exhausting physical activity. Try not to strain your ribs during normal activity. This would include any activities using chest, belly (abdominal), and side muscles, especially if heavy weights are used.   Use ice for 15 to 20 minutes per hour while awake for the first 2 days. Place the ice in a plastic bag, and place a towel between the bag of ice and your skin.   Only take over-the-counter or prescription medicines for pain, discomfort, or fever as directed by your caregiver.  SEEK IMMEDIATE MEDICAL CARE IF:    Your pain increases or you are very uncomfortable.   You have a fever.   You develop difficulty with your breathing.   You cough up blood.   You develop worse chest pains, shortness of breath, sweating, or vomiting.   You develop new, unexplained problems (symptoms).  MAKE SURE YOU:    Understand these instructions.   Will watch your condition.   Will get help right away if you are not doing well or get worse.  Document Released: 03/27/2005 Document Revised: 06/06/2011 Document Reviewed: 02/03/2008  [employer] Patient Information 2012 [employer]

## 2012-06-18 IMAGING — US US TRANSVAGINAL NON-OB
1 series · 14 of 23 positions shown · non-contrast
Comparison: 03/02/2011

CLINICAL DATA: Bleeding

TRANSVAGINAL ULTRASOUND OF PELVIS
TECHNIQUE: Transvaginal ultrasound examinations of the pelvis were
performed. Transabdominal technique was performed for global
imaging of the pelvis including uterus, ovaries, adnexal regions,
and pelvic cul-de-sac.

[Series 1: us transvaginal non-ob · 23 acquisitions, 14 frames shown]
[im 1/23]
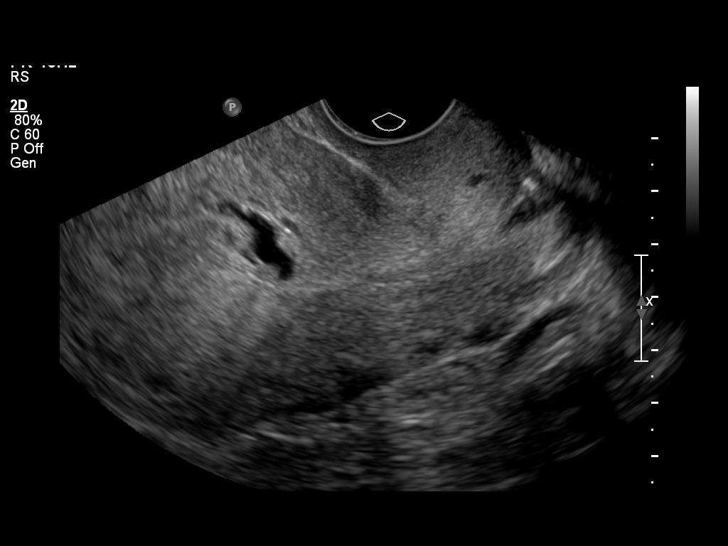
[im 3/23]
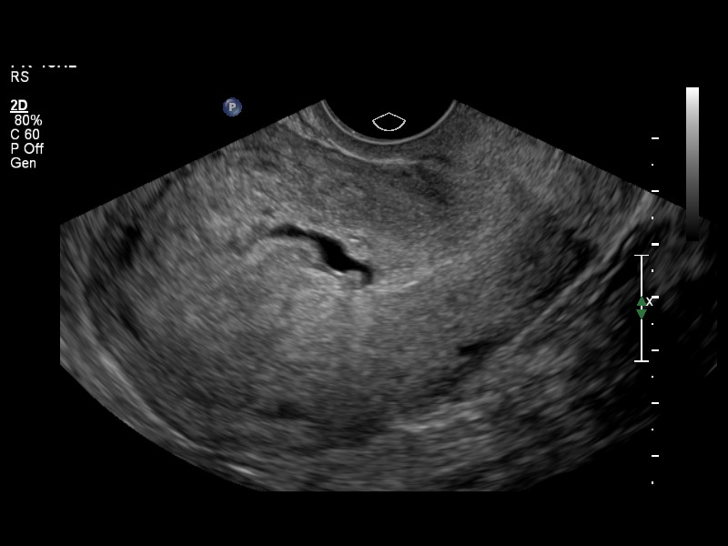
[im 5/23]
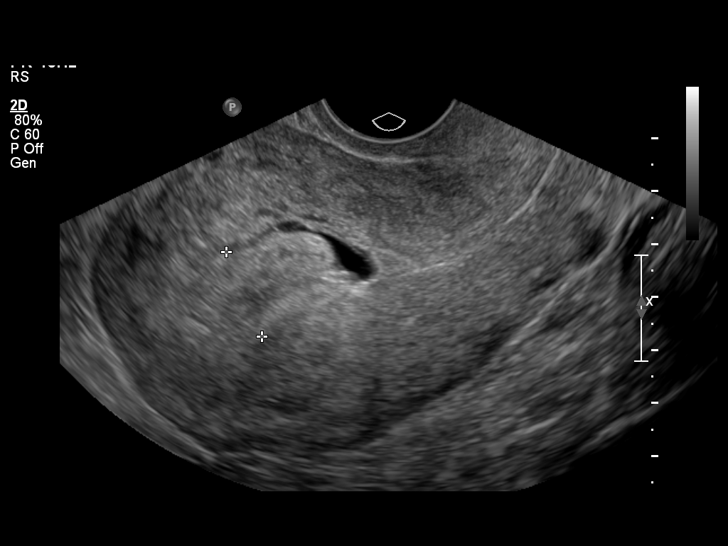
[im 6/23]
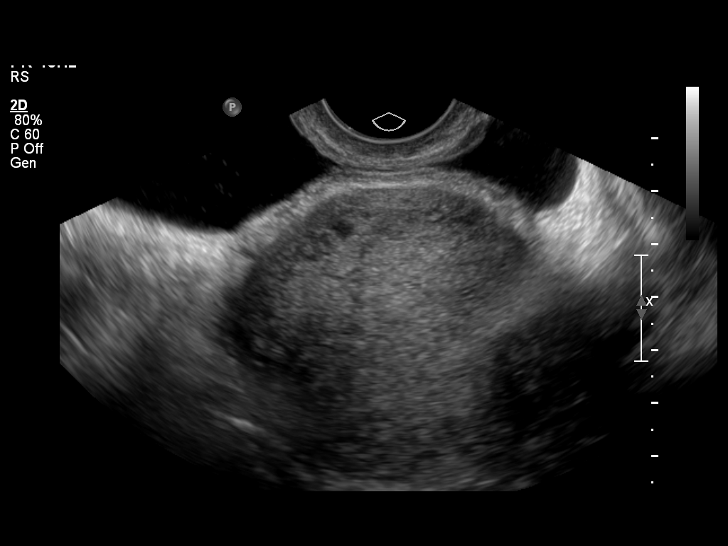
[im 8/23]
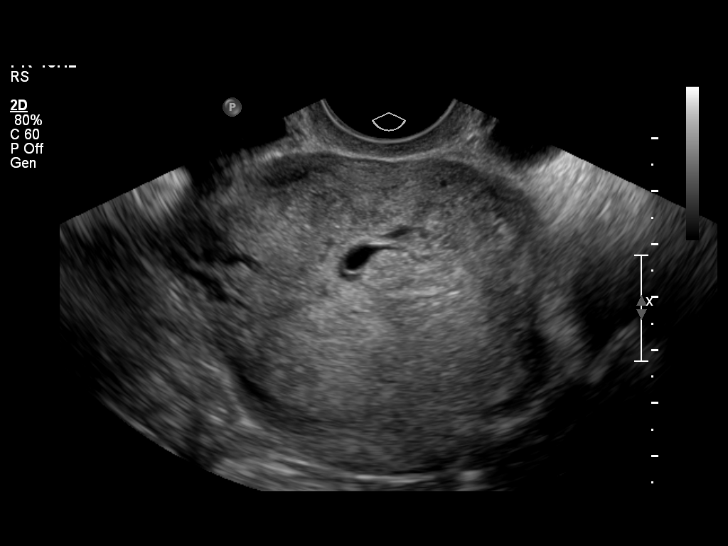
[im 10/23]
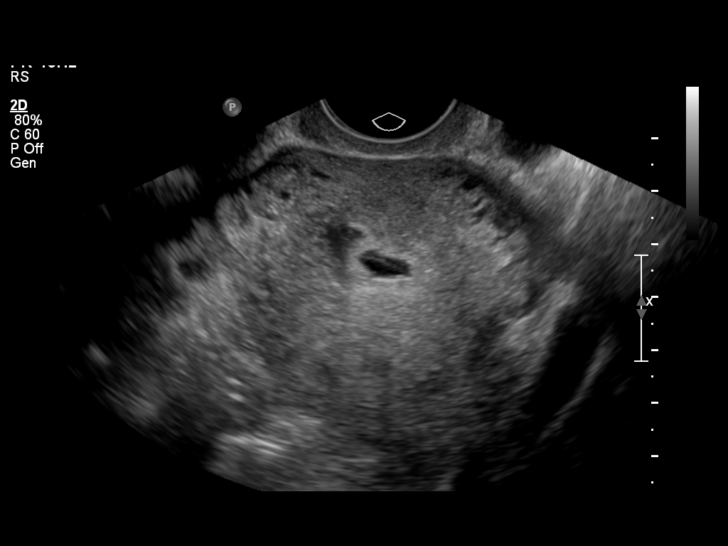
[im 11/23]
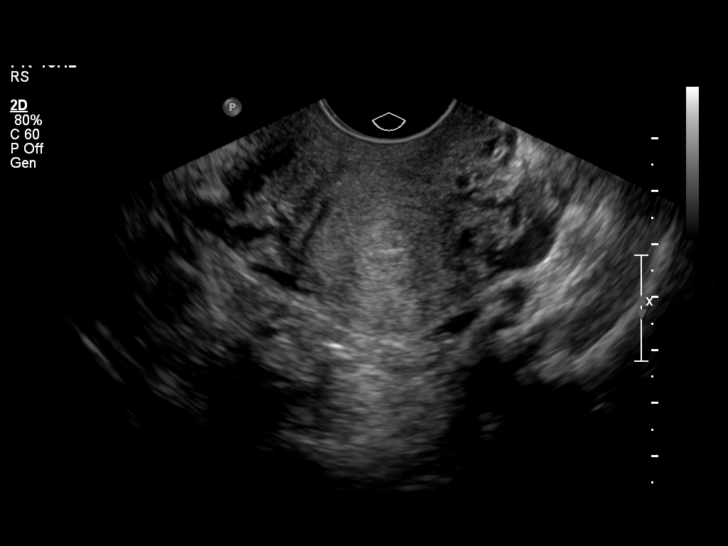
[im 13/23]
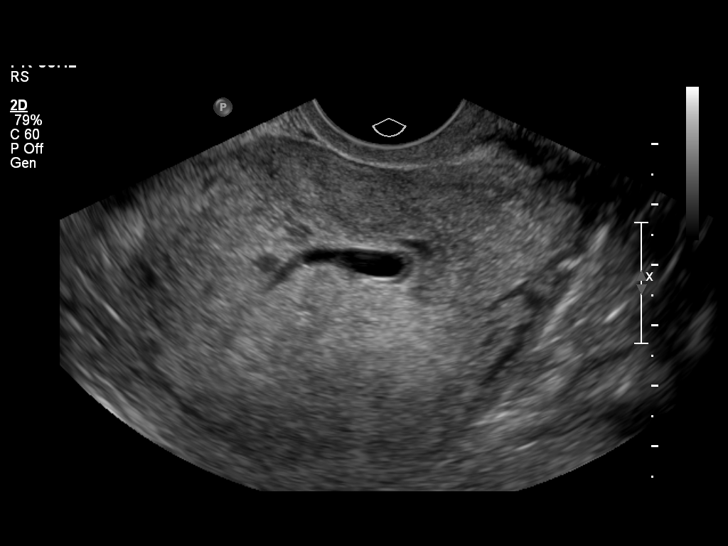
[im 14/23]
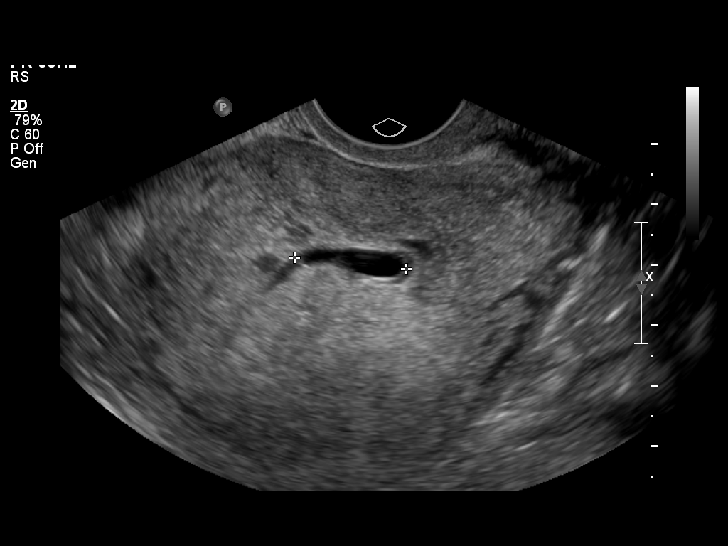
[im 16/23]
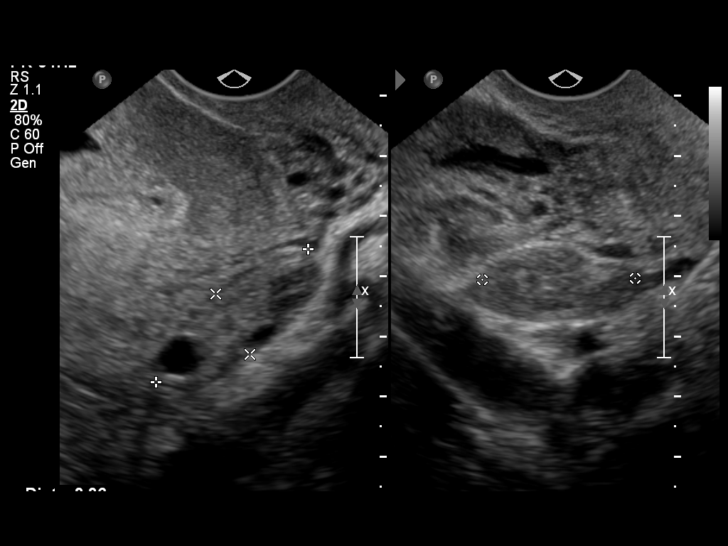
[im 18/23]
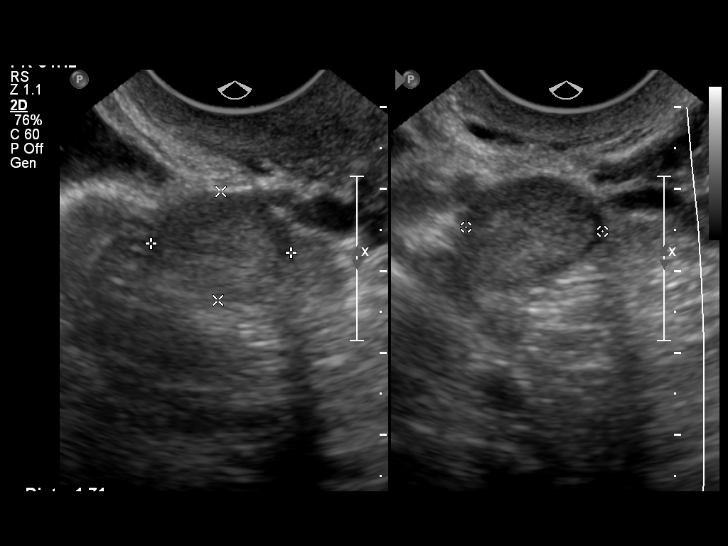
[im 19/23]
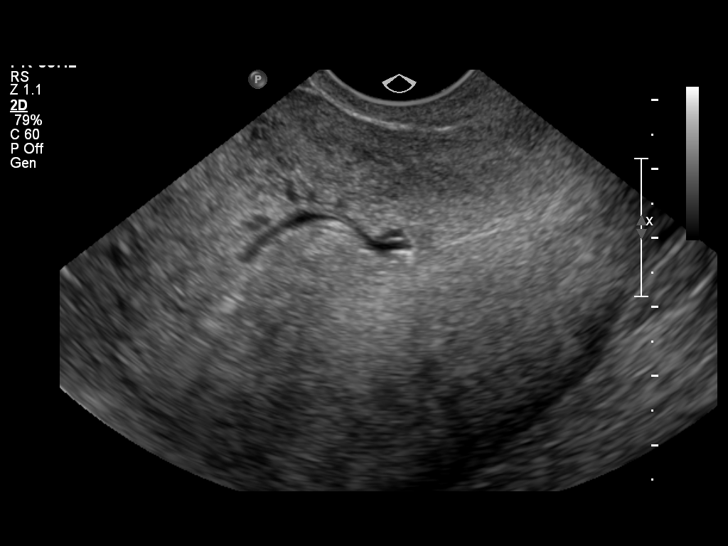
[im 21/23]
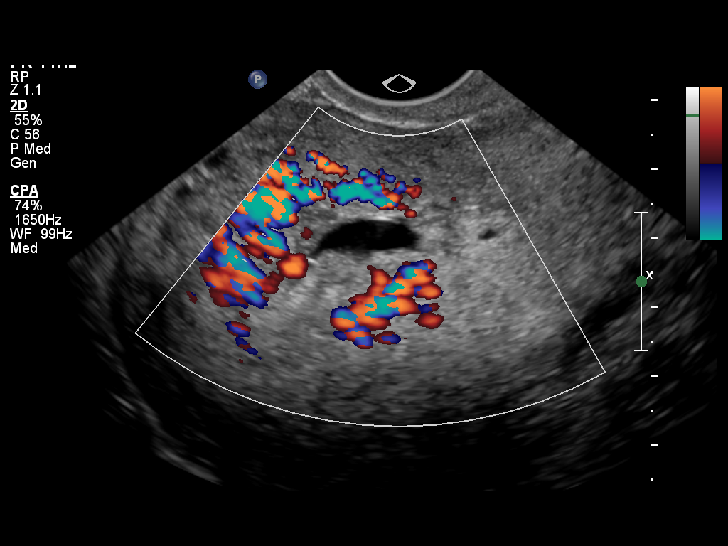
[im 23/23]
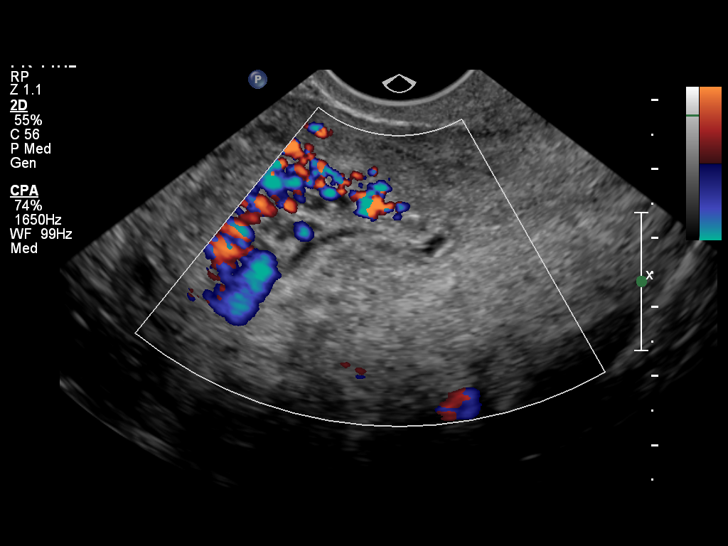

[14 of 23 positions shown; findings below may reference images not displayed]

FINDINGS: Uterus: Normal in size and appearance

Endometrium: Thickened, measuring up to 1.7 cm. Irregular/collapsed
appearing retained intrauterine gestational sac, measuring 1.9 cm
in length.  No yolk sac or embryo.

Right ovary:  Normal appearance/no adnexal mass

Left ovary: Normal appearance/no adnexal mass

Other findings: No free fluid
IMPRESSION: Thickened appearance of the endometrium.  Irregular/elongated
appearing anembryonic intrauterine gestational sac.

## 2012-08-09 IMAGING — US US TRANSVAGINAL NON-OB
1 series · 13 of 25 positions shown · non-contrast
Comparison: Pelvic ultrasound performed 03/20/2011

CLINICAL DATA: Pelvic pain and vaginal bleeding since spontaneous
abortion in March 2011.

TRANSVAGINAL ULTRASOUND OF PELVIS
TECHNIQUE: Transvaginal ultrasound examination of the pelvis was
performed including evaluation of the uterus, ovaries, adnexal
regions, and pelvic cul-de-sac.

[Series 1: us transvaginal non-ob · 13 of 32 slices shown]
[im 1/32]
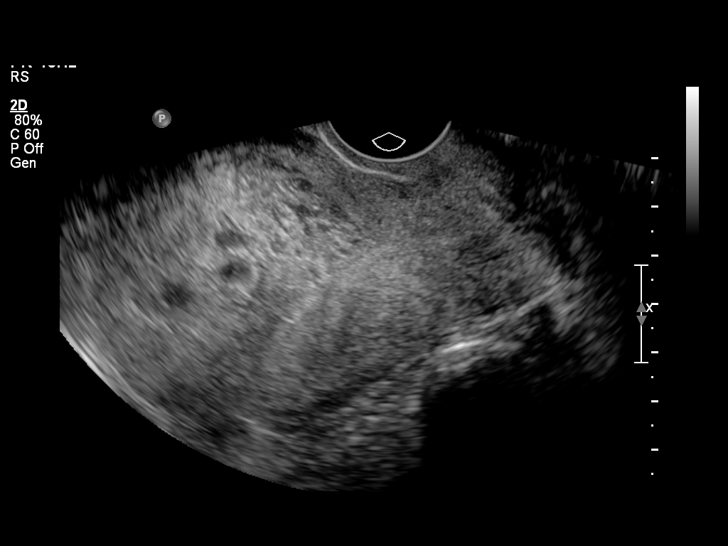
[im 3/32]
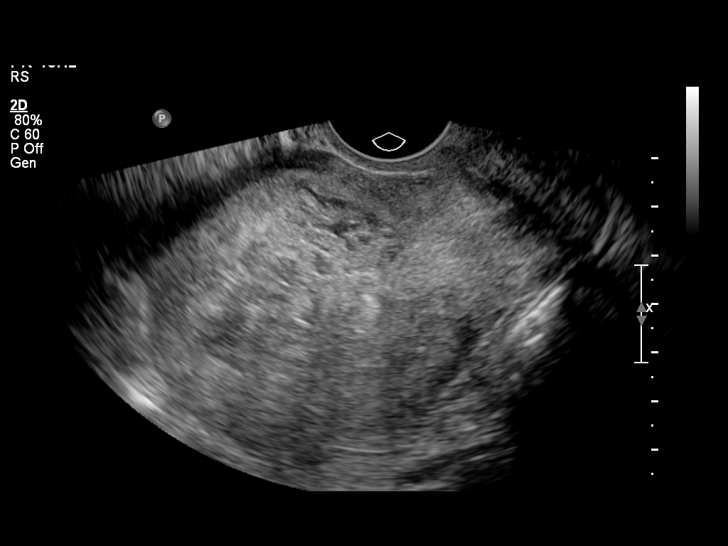
[im 6/32]
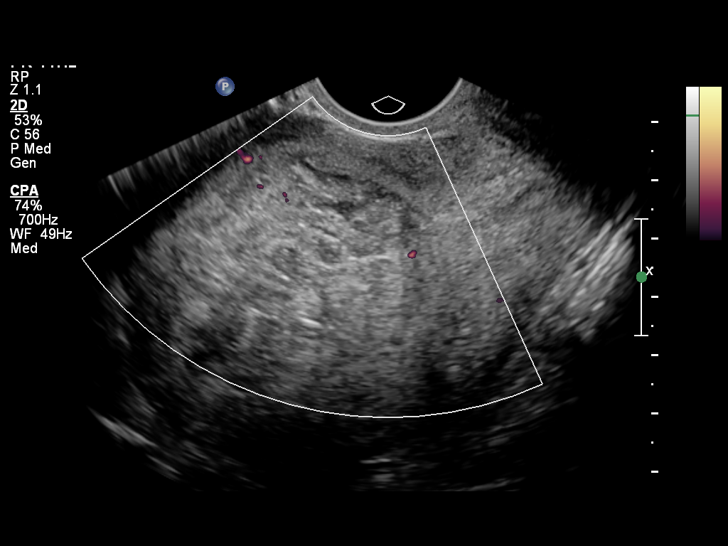
[im 8/32]
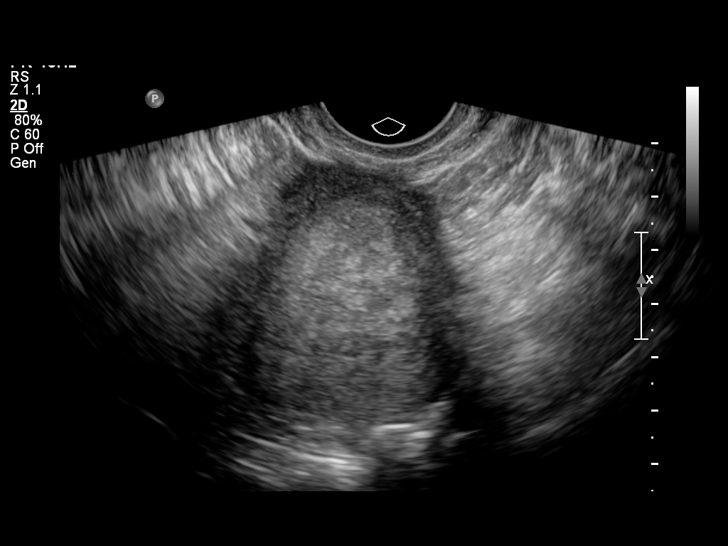
[im 11/32]
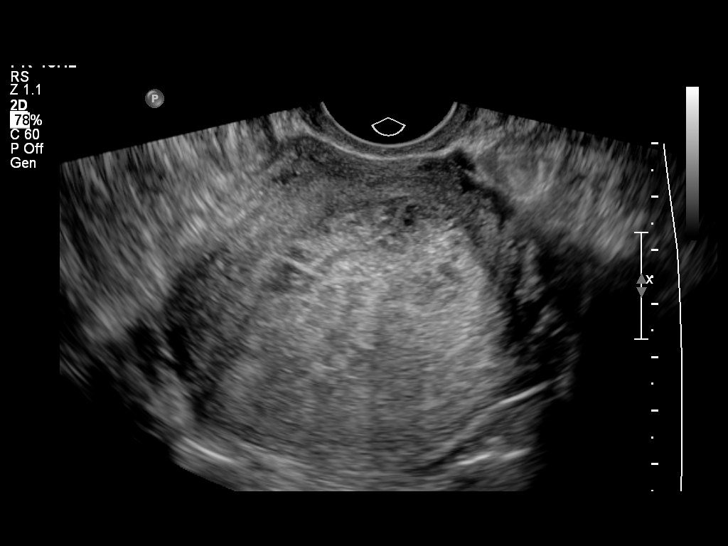
[im 13/32]
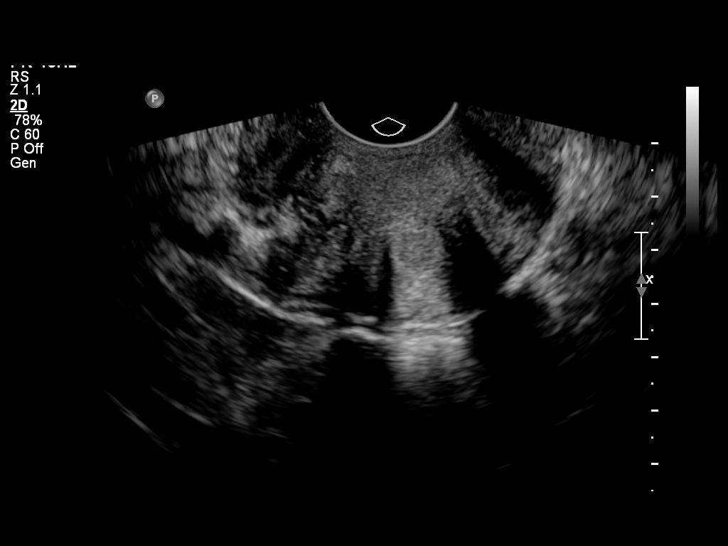
[im 16/32]
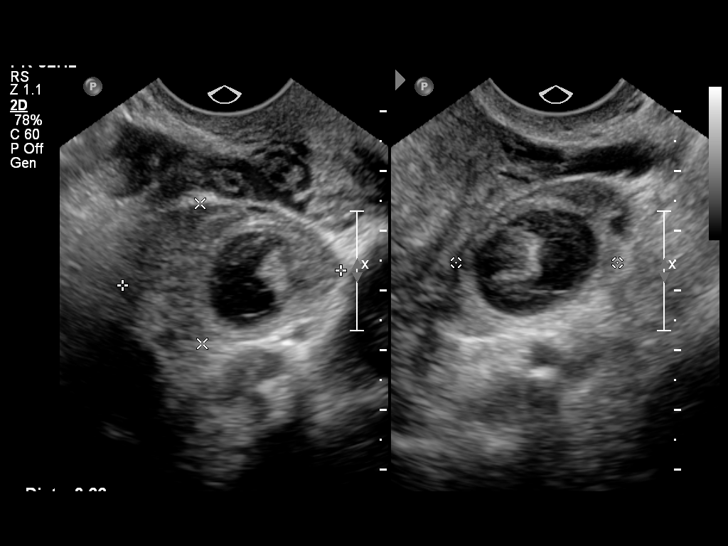
[im 19/32]
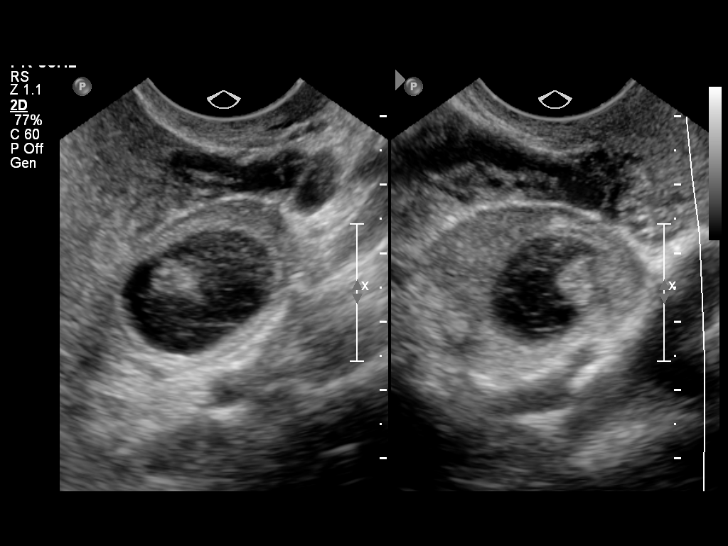
[im 21/32]
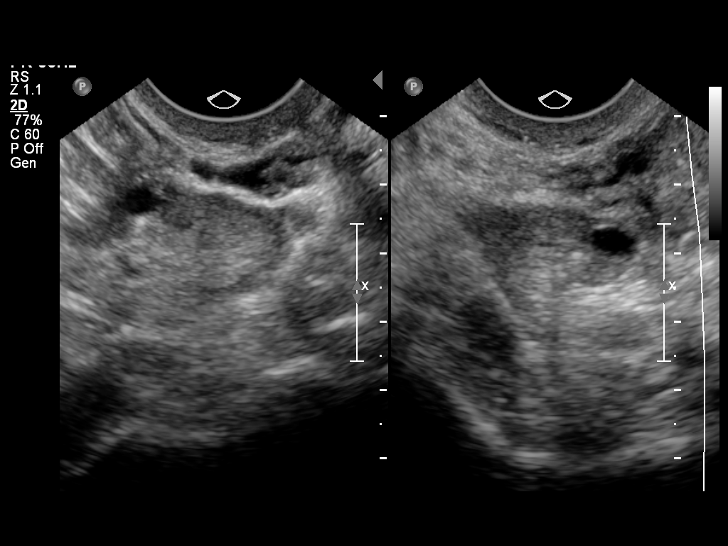
[im 24/32]
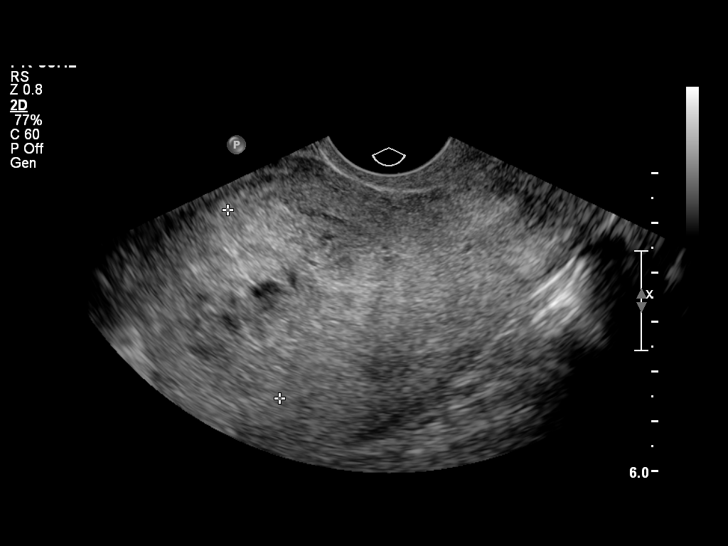
[im 26/32]
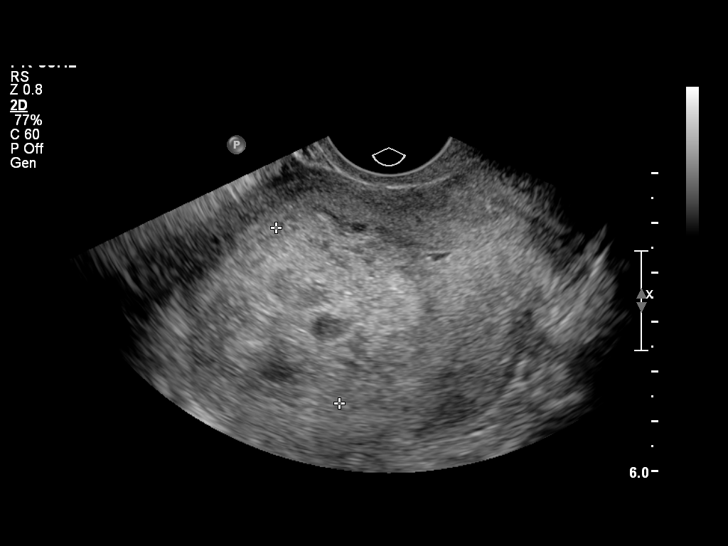
[im 29/32]
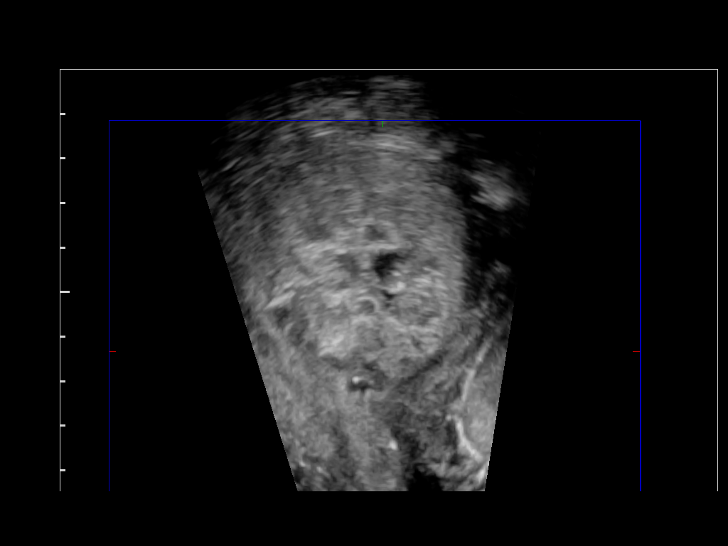
[im 32/32]
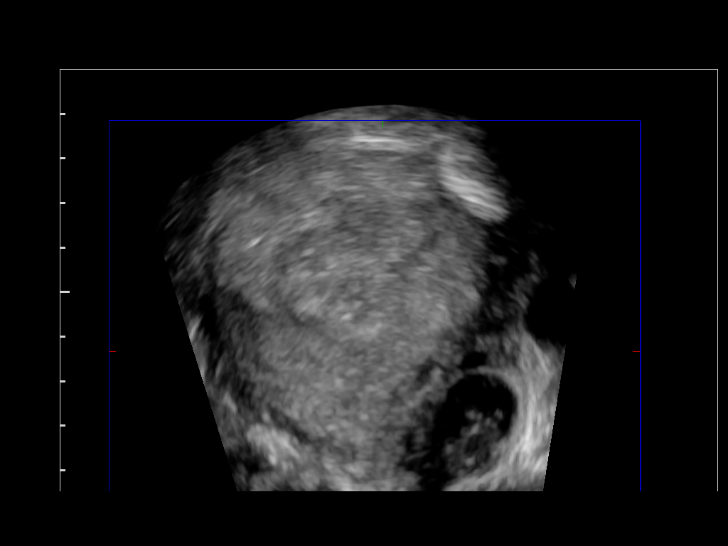

[13 of 25 positions shown; findings below may reference images not displayed]

FINDINGS: Uterus:  Mildly enlarged, measuring 9.8 x 6.0 x 6.7 cm.  Myometrial
echogenicity is within normal limits.

Endometrium: The endometrial echo complex is markedly thickened,
measuring 3.9 cm, with multiple areas of decreased echogenicity and
minimal associated blood flow. This has increased from 1.7 cm on
the prior ultrasound.  Given the negative pregnancy test, this does
not reflect a molar pregnancy; it could reflect endometrial
carcinoma or possibly, given the recent spontaneous abortion,
continued growth of retained products of conception.

Right ovary: Normal appearance/no adnexal mass; measures 2.7 x
x 2.1 cm.

Left ovary: Normal appearance/no adnexal mass; measures 3.7 x 2.3 x
2.7 cm.  A complex cystic structure with an apparent soft tissue
focus within the left ovary, measuring 2.2 cm, may reflect a small
hemorrhagic cyst.

Other Findings:  No free fluid seen within the pelvic cul-de-sac.
IMPRESSION: 1.  Progressively worsening focal thickening of the endometrial
echo complex, with markedly heterogeneous echogenicity and minimal
associated blood flow.  This measures 3.9 cm; given the negative
pregnancy test, this does not reflect a molar pregnancy.  It could
reflect endometrial carcinoma or possibly, given the recent
spontaneous abortion, continued growth of retained products of
conception.
2.  Complex cystic focus in the left ovary, with apparent
associated soft tissue component, may reflect a small hemorrhagic
cyst.

## 2014-02-27 ENCOUNTER — Emergency Department (HOSPITAL_COMMUNITY): Payer: 59

## 2014-02-27 ENCOUNTER — Encounter (HOSPITAL_COMMUNITY): Payer: Self-pay | Admitting: Emergency Medicine

## 2014-02-27 ENCOUNTER — Emergency Department (HOSPITAL_COMMUNITY)
Admission: EM | Admit: 2014-02-27 | Discharge: 2014-02-27 | Disposition: A | Discharge status: Home / Self Care | Payer: 59 | Attending: Emergency Medicine | Admitting: Emergency Medicine

## 2014-02-27 DIAGNOSIS — R197 Diarrhea, unspecified: Secondary | ICD-10-CM | POA: Insufficient documentation

## 2014-02-27 DIAGNOSIS — N760 Acute vaginitis: Secondary | ICD-10-CM | POA: Diagnosis not present

## 2014-02-27 DIAGNOSIS — N898 Other specified noninflammatory disorders of vagina: Secondary | ICD-10-CM | POA: Insufficient documentation

## 2014-02-27 DIAGNOSIS — B9689 Other specified bacterial agents as the cause of diseases classified elsewhere: Secondary | ICD-10-CM | POA: Insufficient documentation

## 2014-02-27 DIAGNOSIS — R109 Unspecified abdominal pain: Secondary | ICD-10-CM | POA: Insufficient documentation

## 2014-02-27 DIAGNOSIS — A499 Bacterial infection, unspecified: Secondary | ICD-10-CM | POA: Insufficient documentation

## 2014-02-27 DIAGNOSIS — Z87891 Personal history of nicotine dependence: Secondary | ICD-10-CM | POA: Insufficient documentation

## 2014-02-27 DIAGNOSIS — R110 Nausea: Secondary | ICD-10-CM | POA: Diagnosis not present

## 2014-02-27 DIAGNOSIS — Z3202 Encounter for pregnancy test, result negative: Secondary | ICD-10-CM | POA: Insufficient documentation

## 2014-02-27 DIAGNOSIS — Z8659 Personal history of other mental and behavioral disorders: Secondary | ICD-10-CM | POA: Diagnosis not present

## 2014-02-27 DIAGNOSIS — Z79899 Other long term (current) drug therapy: Secondary | ICD-10-CM | POA: Diagnosis not present

## 2014-02-27 LAB — URINALYSIS, ROUTINE W REFLEX MICROSCOPIC
BILIRUBIN URINE: NEGATIVE
Glucose, UA: NEGATIVE mg/dL
Hgb urine dipstick: NEGATIVE
KETONES UR: NEGATIVE mg/dL
NITRITE: NEGATIVE
Protein, ur: NEGATIVE mg/dL
SPECIFIC GRAVITY, URINE: 1.01 (ref 1.005–1.030)
UROBILINOGEN UA: 0.2 mg/dL (ref 0.0–1.0)
pH: 6.5 (ref 5.0–8.0)

## 2014-02-27 LAB — LIPASE, BLOOD: LIPASE: 134 U/L — AB (ref 11–59)

## 2014-02-27 LAB — CBC WITH DIFFERENTIAL/PLATELET
BASOS ABS: 0 10*3/uL (ref 0.0–0.1)
Basophils Relative: 0 % (ref 0–1)
EOS ABS: 0.1 10*3/uL (ref 0.0–0.7)
EOS PCT: 2 % (ref 0–5)
HCT: 41.8 % (ref 36.0–46.0)
Hemoglobin: 14.3 g/dL (ref 12.0–15.0)
LYMPHS PCT: 45 % (ref 12–46)
Lymphs Abs: 2.5 10*3/uL (ref 0.7–4.0)
MCH: 29.2 pg (ref 26.0–34.0)
MCHC: 34.2 g/dL (ref 30.0–36.0)
MCV: 85.3 fL (ref 78.0–100.0)
Monocytes Absolute: 0.1 10*3/uL (ref 0.1–1.0)
Monocytes Relative: 2 % — ABNORMAL LOW (ref 3–12)
Neutro Abs: 2.7 10*3/uL (ref 1.7–7.7)
Neutrophils Relative %: 51 % (ref 43–77)
PLATELETS: 146 10*3/uL — AB (ref 150–400)
RBC: 4.9 MIL/uL (ref 3.87–5.11)
RDW: 13.3 % (ref 11.5–15.5)
WBC: 5.4 10*3/uL (ref 4.0–10.5)

## 2014-02-27 LAB — COMPREHENSIVE METABOLIC PANEL
ALT: 21 U/L (ref 0–35)
ANION GAP: 14 (ref 5–15)
AST: 15 U/L (ref 0–37)
Albumin: 3.8 g/dL (ref 3.5–5.2)
Alkaline Phosphatase: 61 U/L (ref 39–117)
BILIRUBIN TOTAL: 0.6 mg/dL (ref 0.3–1.2)
BUN: 16 mg/dL (ref 6–23)
CHLORIDE: 103 meq/L (ref 96–112)
CO2: 22 meq/L (ref 19–32)
CREATININE: 0.73 mg/dL (ref 0.50–1.10)
Calcium: 9.3 mg/dL (ref 8.4–10.5)
GLUCOSE: 84 mg/dL (ref 70–99)
Potassium: 4.2 mEq/L (ref 3.7–5.3)
Sodium: 139 mEq/L (ref 137–147)
Total Protein: 7.5 g/dL (ref 6.0–8.3)

## 2014-02-27 LAB — WET PREP, GENITAL
Trich, Wet Prep: NONE SEEN
WBC WET PREP: NONE SEEN
Yeast Wet Prep HPF POC: NONE SEEN

## 2014-02-27 LAB — URINE MICROSCOPIC-ADD ON

## 2014-02-27 LAB — POC URINE PREG, ED: Preg Test, Ur: NEGATIVE

## 2014-02-27 MED ORDER — LOPERAMIDE HCL 2 MG PO CAPS: 2.0000 mg | ORAL_CAPSULE | Freq: Four times a day (QID) | ORAL | Status: DC | PRN

## 2014-02-27 MED ORDER — HYDROCODONE-ACETAMINOPHEN 5-325 MG PO TABS: 1.0000 | ORAL_TABLET | ORAL | Status: DC | PRN

## 2014-02-27 MED ORDER — METRONIDAZOLE 500 MG PO TABS
500.0000 mg | ORAL_TABLET | Freq: Once | ORAL | Status: AC
  Administered 2014-02-27: 500 mg via ORAL
  Filled 2014-02-27: qty 1

## 2014-02-27 MED ORDER — FENTANYL CITRATE 0.05 MG/ML IJ SOLN
50.0000 ug | Freq: Once | INTRAMUSCULAR | Status: AC
  Administered 2014-02-27: 50 ug via INTRAVENOUS
  Filled 2014-02-27: qty 2

## 2014-02-27 MED ORDER — SODIUM CHLORIDE 0.9 % IV BOLUS (SEPSIS)
1000.0000 mL | Freq: Once | INTRAVENOUS | Status: AC
  Administered 2014-02-27: 1000 mL via INTRAVENOUS

## 2014-02-27 MED ORDER — ONDANSETRON 4 MG PO TBDP
4.0000 mg | ORAL_TABLET | Freq: Once | ORAL | Status: AC
  Administered 2014-02-27: 4 mg via ORAL
  Filled 2014-02-27: qty 1

## 2014-02-27 MED ORDER — FENTANYL CITRATE 0.05 MG/ML IJ SOLN
100.0000 ug | Freq: Once | INTRAMUSCULAR | Status: AC
  Administered 2014-02-27: 100 ug via INTRAVENOUS
  Filled 2014-02-27: qty 2

## 2014-02-27 MED ORDER — METRONIDAZOLE 500 MG PO TABS: 500.0000 mg | ORAL_TABLET | Freq: Two times a day (BID) | ORAL | Status: DC

## 2014-02-27 MED ORDER — ONDANSETRON HCL 4 MG/2ML IJ SOLN
4.0000 mg | Freq: Once | INTRAMUSCULAR | Status: AC
  Administered 2014-02-27: 4 mg via INTRAVENOUS
  Filled 2014-02-27: qty 2

## 2014-02-27 MED ORDER — MORPHINE SULFATE 4 MG/ML IJ SOLN
4.0000 mg | Freq: Once | INTRAMUSCULAR | Status: DC
  Filled 2014-02-27: qty 1

## 2014-02-27 MED ORDER — OXYCODONE-ACETAMINOPHEN 5-325 MG PO TABS
1.0000 | ORAL_TABLET | Freq: Once | ORAL | Status: AC
  Administered 2014-02-27: 1 via ORAL
  Filled 2014-02-27: qty 1

## 2014-02-27 MED ORDER — ONDANSETRON HCL 4 MG PO TABS: 4.0000 mg | ORAL_TABLET | Freq: Four times a day (QID) | ORAL | Status: DC

## 2014-02-27 NOTE — ED Provider Notes (Signed)
CSN: [id number]     Arrival date & time 02/27/14  1504 History   First MD Initiated Contact with Patient 02/27/14 1733     Chief Complaint  Patient presents with  . Abdominal Pain  . Nausea  . Diarrhea     (Consider location/radiation/quality/duration/timing/severity/associated sxs/prior Treatment) HPI  Patient to the emergency department with complaints of abdominal pain, cramping, nausea, diarrhea without vomiting. Symptoms started yesterday but have progressed today. They're intermittent a sharp. She had an appendectomy approximately 10 years ago. She is unable to localize the pain but believes it is worse suprapubic and right lower quadrant, left lower quadrant. His also had 6 episodes of watery diarrhea. She describes the pain as cramping like contractions. She denies having history of pains like this past. No fevers and no vomiting. No chest pains. Denies dysuria, vaginal discharge or bleeding. LMP 3 weeks ago.   Past Medical History  Diagnosis Date  . Depression    Past Surgical History  Procedure Laterality Date  . Appendectomy    . Dilation and evacuation  05/12/2011    Procedure: DILATATION AND EVACUATION (D&E);  Surgeon: Florian Buff, MD;  Location: [facility];  Service: Gynecology;  Laterality: N/A;   Family History  Problem Relation Age of Onset  . Diabetes Mother   . Asthma Mother    History  Substance Use Topics  . Smoking status: Former Research scientist (life sciences)  . Smokeless tobacco: Not on file  . Alcohol Use: No   OB History   Grav Para Term Preterm Abortions TAB SAB Ect Mult Living   8 4 4  0 2 1 1  0 0 4     Review of Systems  Review of Systems  Gen: no weight loss, fevers, chills, night sweats  Eyes: no occular draining, occular pain,  No visual changes  Nose: no epistaxis or rhinorrhea  Mouth: no dental pain, no sore throat  Neck: no neck pain  Lungs: No hemoptysis. No wheezing or coughing CV:  No palpitations, dependent edema or orthopnea. No chest pain Abd: +  diarrhea,  abdominal pain, nausea No Vomiting GU: no dysuria or gross hematuria  MSK:  No muscle weakness, No muscular pain Neuro: no headache, no focal neurologic deficits  Skin: no rash , no wounds Psyche: no complaints of depression or anxiety   Allergies  Bee venom and Morphine  Home Medications   Prior to Admission medications   Medication Sig Start Date End Date Taking? Authorizing Provider  Biotin 10 MG CAPS Take 1 capsule by mouth daily.   Yes Historical Provider, MD  ibuprofen (ADVIL,MOTRIN) 200 MG tablet Take 1,200 mg by mouth every 6 (six) hours as needed for moderate pain.   Yes Historical Provider, MD  HYDROcodone-acetaminophen (NORCO/VICODIN) 5-325 MG per tablet Take 1-2 tablets by mouth every 4 (four) hours as needed. 02/27/14   Linus Mako, PA-C  loperamide (IMODIUM) 2 MG capsule Take 1 capsule (2 mg total) by mouth 4 (four) times daily as needed for diarrhea or loose stools. 02/27/14   Felisha Claytor Marilu Favre, PA-C  metroNIDAZOLE (FLAGYL) 500 MG tablet Take 1 tablet (500 mg total) by mouth 2 (two) times daily. 02/27/14   Linus Mako, PA-C  ondansetron (ZOFRAN) 4 MG tablet Take 1 tablet (4 mg total) by mouth every 6 (six) hours. 02/27/14   Tyshawna Alarid Marilu Favre, PA-C   BP 130/74  Pulse 63  Temp(Src) 98.4 F (36.9 C) (Oral)  Resp 16  SpO2 98% Physical Exam  Nursing note and  vitals reviewed. Constitutional: She appears well-developed and well-nourished. No distress.  HENT:  Head: Normocephalic and atraumatic.  Eyes: Pupils are equal, round, and reactive to light.  Neck: Normal range of motion. Neck supple.  Cardiovascular: Normal rate and regular rhythm.   Pulmonary/Chest: Effort normal.  Abdominal: Soft. Bowel sounds are normal. She exhibits no distension and no ascites. There is tenderness in the right lower quadrant and suprapubic area. There is no rigidity, no rebound, no guarding and no CVA tenderness.  Tenderness to palpation is mild on exam. She shows no guarding.  Or peritoneal signs.  Genitourinary: Uterus normal. Uterus is not enlarged. Cervix exhibits no motion tenderness. No bleeding around the vagina. No foreign body around the vagina. Vaginal discharge found.  Neurological: She is alert.  Skin: Skin is warm and dry.    ED Course  Procedures (including critical care time) Labs Review Labs Reviewed  WET PREP, GENITAL - Abnormal; Notable for the following:    Clue Cells Wet Prep HPF POC MANY (*)    All other components within normal limits  CBC WITH DIFFERENTIAL - Abnormal; Notable for the following:    Platelets 146 (*)    Monocytes Relative 2 (*)    All other components within normal limits  LIPASE, BLOOD - Abnormal; Notable for the following:    Lipase 134 (*)    All other components within normal limits  URINALYSIS, ROUTINE W REFLEX MICROSCOPIC - Abnormal; Notable for the following:    APPearance CLOUDY (*)    Leukocytes, UA TRACE (*)    All other components within normal limits  GC/CHLAMYDIA PROBE AMP  COMPREHENSIVE METABOLIC PANEL  URINE MICROSCOPIC-ADD ON  POC URINE PREG, ED    Imaging Review US Abdomen Complete  02/27/2014   CLINICAL DATA:  Elevated lipase.  Abdominal pain.  EXAM: ULTRASOUND ABDOMEN COMPLETE  COMPARISON:  None.  FINDINGS: Gallbladder:  No gallstones or wall thickening visualized. No sonographic Murphy sign noted.  Common bile duct:  Diameter: Normal caliber, 5 mm.  Liver:  No focal lesion identified. Within normal limits in parenchymal echogenicity.  IVC:  No abnormality visualized.  Pancreas:  Visualized portions unremarkable. Pancreatic tail and portions of the pancreatic body obscured by overlying bowel gas.  Spleen:  Size and appearance within normal limits.  Right Kidney:  Length: 11.4 cm. Echogenicity within normal limits. No mass or hydronephrosis visualized.  Left Kidney:  Length: 11.6 cm. Echogenicity within normal limits. No mass or hydronephrosis visualized.  Abdominal aorta:  No aneurysm visualized.   Other findings:  None.  IMPRESSION: Unremarkable abdominal ultrasound. Portions of the pancreas are obscured by overlying bowel gas.   Electronically Signed   By: Rolm Baptise M.D.   On: 02/27/2014 21:10     EKG Interpretation None      MDM   Final diagnoses:  Diarrhea  Bacterial vaginosis    The patient's laboratory workup shows that she has an elevated lipase. Her pain is worse in the lower abdomen however she has never had an elevated lipase therefore will gets an abdominal ultrasound for further evaluation. Pelvic cart ordered, will do pelvic exam to rule out any gynecological etiology of pain.  8: 15 pm No abnormalities noted on her CMP. No elevated white counts and slightly low platelet. Urinalysis only shows trace leukocytes in her urine pregnancy negative. Her vital signs here are all within normal limits. Abdominal ultrasound pending pelvic exam pelvic pending.  11:06pm Patients abdominal US has come back reassuring. No gallbladder, liver  or pancreatic disease.  Her Wet Prep came back showing clue cells (MANY) SHe has bacterial vaginosis which would explain her lower abdominal pain. She does not have an elevated white count.  Pain controlled easily in ED with IV medications. Referral to womens outpatient clinic.  43 y.o.Seneca Gadbois Tippetts's evaluation in the Emergency Department is complete. It has been determined that no acute conditions requiring further emergency intervention are present at this time. The patient/guardian have been advised of the diagnosis and plan. We have discussed signs and symptoms that warrant return to the ED, such as changes or worsening in symptoms.  Vital signs are stable at discharge. Filed Vitals:   02/27/14 2152  BP: 130/74  Pulse: 63  Temp:   Resp: 16    Patient/guardian has voiced understanding and agreed to follow-up with the PCP or specialist.   Linus Mako, PA-C 02/27/14 2309

## 2014-02-27 NOTE — ED Notes (Signed)
Pt here with c/o of abd cramping. Nausea, denies vomiting. Describes pain as 10/10 sharp. States that it feels like a contraction. Denies fever.

## 2014-02-27 NOTE — ED Notes (Signed)
Ultrasound at bedside

## 2014-02-27 NOTE — Discharge Instructions (Signed)
Bacterial Vaginosis Bacterial vaginosis is a vaginal infection that occurs when the normal balance of bacteria in the vagina is disrupted. It results from an overgrowth of certain bacteria. This is the most common vaginal infection in women of childbearing age. Treatment is important to prevent complications, especially in pregnant women, as it can cause a premature delivery. CAUSES  Bacterial vaginosis is caused by an increase in harmful bacteria that are normally present in smaller amounts in the vagina. Several different kinds of bacteria can cause bacterial vaginosis. However, the reason that the condition develops is not fully understood. RISK FACTORS Certain activities or behaviors can put you at an increased risk of developing bacterial vaginosis, including:  Having a new sex partner or multiple sex partners.  Douching.  Using an intrauterine device (IUD) for contraception. Women do not get bacterial vaginosis from toilet seats, bedding, swimming pools, or contact with objects around them. SIGNS AND SYMPTOMS  Some women with bacterial vaginosis have no signs or symptoms. Common symptoms include:  Grey vaginal discharge.  A fishlike odor with discharge, especially after sexual intercourse.  Itching or burning of the vagina and vulva.  Burning or pain with urination. DIAGNOSIS  Your health care provider will take a medical history and examine the vagina for signs of bacterial vaginosis. A sample of vaginal fluid may be taken. Your health care provider will look at this sample under a microscope to check for bacteria and abnormal cells. A vaginal pH test may also be done.  TREATMENT  Bacterial vaginosis may be treated with antibiotic medicines. These may be given in the form of a pill or a vaginal cream. A second round of antibiotics may be prescribed if the condition comes back after treatment.  HOME CARE INSTRUCTIONS   Only take over-the-counter or prescription medicines as  directed by your health care provider.  If antibiotic medicine was prescribed, take it as directed. Make sure you finish it even if you start to feel better.  Do not have sex until treatment is completed.  Tell all sexual partners that you have a vaginal infection. They should see their health care provider and be treated if they have problems, such as a mild rash or itching.  Practice safe sex by using condoms and only having one sex partner. SEEK MEDICAL CARE IF:   Your symptoms are not improving after 3 days of treatment.  You have increased discharge or pain.  You have a fever. MAKE SURE YOU:   Understand these instructions.  Will watch your condition.  Will get help right away if you are not doing well or get worse. FOR MORE INFORMATION  Centers for Disease Control and Prevention, Division of STD Prevention: AppraiserFraud.fi American Sexual Health Association (ASHA): www.ashastd.org  Document Released: 06/17/2005 Document Revised: 04/07/2013 Document Reviewed: 01/27/2013 [facility] Patient Information 2015 [place], [place]. This information is not intended to replace advice given to you by your health care provider. Make sure you discuss any questions you have with your health care provider.    Diarrhea Diarrhea is frequent loose and watery bowel movements. It can cause you to feel weak and dehydrated. Dehydration can cause you to become tired and thirsty, have a dry mouth, and have decreased urination that often is dark yellow. Diarrhea is a sign of another problem, most often an infection that will not last long. In most cases, diarrhea typically lasts 2-3 days. However, it can last longer if it is a sign of something more serious. It is important to  treat your diarrhea as directed by your caregiver to lessen or prevent future episodes of diarrhea. CAUSES  Some common causes include:  Gastrointestinal infections caused by viruses, bacteria, or parasites.  Food poisoning or  food allergies.  Certain medicines, such as antibiotics, chemotherapy, and laxatives.  Artificial sweeteners and fructose.  Digestive disorders. HOME CARE INSTRUCTIONS  Ensure adequate fluid intake (hydration): Have 1 cup (8 oz) of fluid for each diarrhea episode. Avoid fluids that contain simple sugars or sports drinks, fruit juices, whole milk products, and sodas. Your urine should be clear or pale yellow if you are drinking enough fluids. Hydrate with an oral rehydration solution that you can purchase at pharmacies, retail stores, and online. You can prepare an oral rehydration solution at home by mixing the following ingredients together:   - tsp table salt.   tsp baking soda.   tsp salt substitute containing potassium chloride.  1  tablespoons sugar.  1 L (34 oz) of water.  Certain foods and beverages may increase the speed at which food moves through the gastrointestinal (GI) tract. These foods and beverages should be avoided and include:  Caffeinated and alcoholic beverages.  High-fiber foods, such as raw fruits and vegetables, nuts, seeds, and whole grain breads and cereals.  Foods and beverages sweetened with sugar alcohols, such as xylitol, sorbitol, and mannitol.  Some foods may be well tolerated and may help thicken stool including:  Starchy foods, such as rice, toast, pasta, low-sugar cereal, oatmeal, grits, baked potatoes, crackers, and bagels.  Bananas.  Applesauce.  Add probiotic-rich foods to help increase healthy bacteria in the GI tract, such as yogurt and fermented milk products.  Wash your hands well after each diarrhea episode.  Only take over-the-counter or prescription medicines as directed by your caregiver.  Take a warm bath to relieve any burning or pain from frequent diarrhea episodes. SEEK IMMEDIATE MEDICAL CARE IF:   You are unable to keep fluids down.  You have persistent vomiting.  You have blood in your stool, or your stools are  black and tarry.  You do not urinate in 6-8 hours, or there is only a small amount of very dark urine.  You have abdominal pain that increases or localizes.  You have weakness, dizziness, confusion, or light-headedness.  You have a severe headache.  Your diarrhea gets worse or does not get better.  You have a fever or persistent symptoms for more than 2-3 days.  You have a fever and your symptoms suddenly get worse. MAKE SURE YOU:   Understand these instructions.  Will watch your condition.  Will get help right away if you are not doing well or get worse. Document Released: 06/07/2002 Document Revised: 11/01/2013 Document Reviewed: 02/23/2012 [facility] Patient Information 2015 [place], [place]. This information is not intended to replace advice given to you by your health care provider. Make sure you discuss any questions you have with your health care provider.

## 2014-02-27 NOTE — ED Notes (Signed)
Pt in lobby on cell phone, Pt NAD. No active vomiting.

## 2014-03-01 LAB — GC/CHLAMYDIA PROBE AMP
CT PROBE, AMP APTIMA: NEGATIVE
GC PROBE AMP APTIMA: NEGATIVE

## 2014-03-01 NOTE — ED Provider Notes (Signed)
Medical screening examination/treatment/procedure(s) were performed by non-physician practitioner and as supervising physician I was immediately available for consultation/collaboration.   EKG Interpretation None        Yazan Gatling L Makenzie Vittorio, MD 03/01/14 1454 

## 2014-04-07 ENCOUNTER — Telehealth: Payer: Self-pay

## 2014-04-07 NOTE — Telephone Encounter (Signed)
Phone call came in from this patient She is not established as a patient but needs some blood work done  Patient has a 10am appointment tomorrow [facility] [address] in [place] @ 10am And another appointment 10/20 @ 11:15 at [facility] to establish care

## 2014-04-19 ENCOUNTER — Ambulatory Visit: Payer: 59 | Admitting: Family Medicine

## 2014-05-02 ENCOUNTER — Encounter (HOSPITAL_COMMUNITY): Payer: Self-pay | Admitting: Emergency Medicine

## 2015-02-12 ENCOUNTER — Other Ambulatory Visit (HOSPITAL_COMMUNITY)
Admission: RE | Admit: 2015-02-12 | Discharge: 2015-02-12 | Disposition: A | Discharge status: Home / Self Care | Payer: 59 | Source: Other Acute Inpatient Hospital | Attending: Psychiatry | Admitting: Psychiatry

## 2015-02-12 DIAGNOSIS — F3181 Bipolar II disorder: Secondary | ICD-10-CM | POA: Insufficient documentation

## 2015-02-12 LAB — CBC
HEMATOCRIT: 43.1 % (ref 36.0–46.0)
HEMOGLOBIN: 14.1 g/dL (ref 12.0–15.0)
MCH: 28.7 pg (ref 26.0–34.0)
MCHC: 32.7 g/dL (ref 30.0–36.0)
MCV: 87.8 fL (ref 78.0–100.0)
PLATELETS: 172 10*3/uL (ref 150–400)
RBC: 4.91 MIL/uL (ref 3.87–5.11)
RDW: 13.5 % (ref 11.5–15.5)
WBC: 5.1 10*3/uL (ref 4.0–10.5)

## 2015-02-12 LAB — LIPID PANEL
CHOLESTEROL: 110 mg/dL (ref 0–200)
HDL: 48 mg/dL (ref >40)
LDL CALC: 43 mg/dL (ref 0–99)
TRIGLYCERIDES: 97 mg/dL (ref <150)
Total CHOL/HDL Ratio: 2.3 RATIO
VLDL: 19 mg/dL (ref 0–40)

## 2015-02-12 LAB — CREATININE, SERUM: Creatinine, Ser: 0.81 mg/dL (ref 0.44–1.00)

## 2015-02-12 LAB — TSH: TSH: 0.524 u[IU]/mL (ref 0.350–4.500)

## 2015-02-12 LAB — AST: AST: 21 U/L (ref 15–41)

## 2015-02-12 LAB — T4, FREE: Free T4: 1.02 ng/dL (ref 0.61–1.12)

## 2015-02-12 LAB — BUN: BUN: 19 mg/dL (ref 6–20)

## 2015-02-12 LAB — GLUCOSE, RANDOM: GLUCOSE: 122 mg/dL — AB (ref 65–99)

## 2015-02-12 LAB — ALT: ALT: 26 U/L (ref 14–54)

## 2015-02-13 LAB — T3 UPTAKE: T3 Uptake Ratio: 29 % (ref 24–39)

## 2015-02-13 LAB — T4: T4, Total: 9.3 ug/dL (ref 4.5–12.0)

## 2015-06-03 ENCOUNTER — Emergency Department (HOSPITAL_COMMUNITY)
Admission: EM | Admit: 2015-06-03 | Discharge: 2015-06-03 | Disposition: A | Discharge status: Home / Self Care | Payer: 59 | Attending: Emergency Medicine | Admitting: Emergency Medicine

## 2015-06-03 ENCOUNTER — Encounter (HOSPITAL_COMMUNITY): Payer: Self-pay

## 2015-06-03 ENCOUNTER — Emergency Department (HOSPITAL_COMMUNITY): Payer: 59

## 2015-06-03 DIAGNOSIS — Z8659 Personal history of other mental and behavioral disorders: Secondary | ICD-10-CM | POA: Insufficient documentation

## 2015-06-03 DIAGNOSIS — R0600 Dyspnea, unspecified: Secondary | ICD-10-CM | POA: Diagnosis not present

## 2015-06-03 DIAGNOSIS — R079 Chest pain, unspecified: Secondary | ICD-10-CM | POA: Diagnosis present

## 2015-06-03 DIAGNOSIS — Z79899 Other long term (current) drug therapy: Secondary | ICD-10-CM | POA: Diagnosis not present

## 2015-06-03 DIAGNOSIS — R0789 Other chest pain: Secondary | ICD-10-CM | POA: Diagnosis not present

## 2015-06-03 DIAGNOSIS — F17210 Nicotine dependence, cigarettes, uncomplicated: Secondary | ICD-10-CM | POA: Diagnosis not present

## 2015-06-03 HISTORY — DX: Post-traumatic stress disorder, unspecified: F43.10

## 2015-06-03 LAB — COMPREHENSIVE METABOLIC PANEL
ALBUMIN: 3.8 g/dL (ref 3.5–5.0)
ALT: 21 U/L (ref 14–54)
AST: 18 U/L (ref 15–41)
Alkaline Phosphatase: 62 U/L (ref 38–126)
Anion gap: 7 (ref 5–15)
BUN: 16 mg/dL (ref 6–20)
CHLORIDE: 106 mmol/L (ref 101–111)
CO2: 25 mmol/L (ref 22–32)
Calcium: 9.1 mg/dL (ref 8.9–10.3)
Creatinine, Ser: 0.86 mg/dL (ref 0.44–1.00)
GFR calc Af Amer: >60 mL/min (ref >60)
GFR calc non Af Amer: >60 mL/min (ref >60)
GLUCOSE: 96 mg/dL (ref 65–99)
POTASSIUM: 4.4 mmol/L (ref 3.5–5.1)
Sodium: 138 mmol/L (ref 135–145)
Total Bilirubin: 0.5 mg/dL (ref 0.3–1.2)
Total Protein: 6.9 g/dL (ref 6.5–8.1)

## 2015-06-03 LAB — PROTIME-INR
INR: 1.05 (ref 0.00–1.49)
PROTHROMBIN TIME: 13.9 s (ref 11.6–15.2)

## 2015-06-03 LAB — CBC
HEMATOCRIT: 42.4 % (ref 36.0–46.0)
Hemoglobin: 14 g/dL (ref 12.0–15.0)
MCH: 28.7 pg (ref 26.0–34.0)
MCHC: 33 g/dL (ref 30.0–36.0)
MCV: 87.1 fL (ref 78.0–100.0)
PLATELETS: 164 10*3/uL (ref 150–400)
RBC: 4.87 MIL/uL (ref 3.87–5.11)
RDW: 13.9 % (ref 11.5–15.5)
WBC: 5.1 10*3/uL (ref 4.0–10.5)

## 2015-06-03 LAB — MAGNESIUM: Magnesium: 1.8 mg/dL (ref 1.7–2.4)

## 2015-06-03 LAB — TROPONIN I: Troponin I: <0.03 ng/mL (ref <0.031)

## 2015-06-03 LAB — D-DIMER, QUANTITATIVE: D-Dimer, Quant: 0.27 ug/mL-FEU (ref 0.00–0.50)

## 2015-06-03 MED ORDER — HYDROCODONE-ACETAMINOPHEN 5-325 MG PO TABS
1.0000 | ORAL_TABLET | Freq: Four times a day (QID) | ORAL | Status: DC | PRN
Start: 2015-06-03 — End: 2018-11-04

## 2015-06-03 MED ORDER — PREDNISONE 20 MG PO TABS
60.0000 mg | ORAL_TABLET | Freq: Once | ORAL | Status: AC
  Administered 2015-06-03: 60 mg via ORAL
  Filled 2015-06-03: qty 3

## 2015-06-03 MED ORDER — ASPIRIN 81 MG PO CHEW
324.0000 mg | CHEWABLE_TABLET | Freq: Once | ORAL | Status: AC
  Administered 2015-06-03: 324 mg via ORAL
  Filled 2015-06-03: qty 4

## 2015-06-03 MED ORDER — PREDNISONE 20 MG PO TABS: 60.0000 mg | ORAL_TABLET | Freq: Every day | ORAL | Status: AC

## 2015-06-03 MED ORDER — HYDROMORPHONE HCL 1 MG/ML IJ SOLN
0.5000 mg | Freq: Once | INTRAMUSCULAR | Status: AC
  Administered 2015-06-03: 0.5 mg via INTRAVENOUS
  Filled 2015-06-03: qty 1

## 2015-06-03 MED ORDER — KETOROLAC TROMETHAMINE 30 MG/ML IJ SOLN
30.0000 mg | Freq: Once | INTRAMUSCULAR | Status: AC
  Administered 2015-06-03: 30 mg via INTRAVENOUS
  Filled 2015-06-03: qty 1

## 2015-06-03 NOTE — ED Notes (Signed)
Bed: [id number] Expected date: 06/03/15 Expected time:  Means of arrival:  Comments: Chest pain

## 2015-06-03 NOTE — ED Notes (Signed)
Pt presents to ED complaining of chest pain since yesterday. States that it feels like a lot of pressure and feels some tingling in her L arm. Also endorses weakness and dizziness. Denies N/V at this time. Pt is current smoker. Pain 9/10.

## 2015-06-03 NOTE — ED Provider Notes (Signed)
CSN: [id number]     Arrival date & time 06/03/15  [id number] History   First MD Initiated Contact with Patient 06/03/15 [phone]     Chief Complaint  Patient presents with  . Chest Pain     (Consider location/radiation/quality/duration/timing/severity/associated sxs/prior Treatment) HPI  Patient resents with concern of chest pain. This episode seems to have been present since yesterday. She notes recent episodes of similar pain over the past few weeks, but no sustained symptoms until today. The pain is anterior, pressure-like, nonradiating with associated dyspnea. No recent changes in medication, activity, diet. She acknowledges increased stress. She denies history of coronary disease, chronic medical problems. She does smoke.  Smoking cessation provided, particularly in light of this patient's evaluation in the ED.  Since onset, no clear alleviating or exacerbating factors for the chest pressure.  Past Medical History  Diagnosis Date  . Depression   . PTSD (post-traumatic stress disorder)    Past Surgical History  Procedure Laterality Date  . Appendectomy    . Dilation and evacuation  05/12/2011    Procedure: DILATATION AND EVACUATION (D&E);  Surgeon: Florian Buff, MD;  Location: [facility];  Service: Gynecology;  Laterality: N/A;   Family History  Problem Relation Age of Onset  . Diabetes Mother   . Asthma Mother    Social History  Substance Use Topics  . Smoking status: Current Every Day Smoker -- 0.50 packs/day    Types: Cigarettes  . Smokeless tobacco: None  . Alcohol Use: No   OB History    Gravida Para Term Preterm AB TAB SAB Ectopic Multiple Living   8 4 4  0 2 1 1  0 0 4     Review of Systems  Constitutional:       Per HPI, otherwise negative  HENT:       Per HPI, otherwise negative  Respiratory:       Per HPI, otherwise negative  Cardiovascular:       Per HPI, no asymmetric loss of sensation, color changes  Gastrointestinal: Negative for vomiting.  Endocrine:        Negative aside from HPI  Genitourinary:       Neg aside from HPI   Musculoskeletal:       Per HPI, otherwise negative  Skin: Negative.   Neurological: Negative for syncope.      Allergies  Bee venom and Morphine  Home Medications   Prior to Admission medications   Medication Sig Start Date End Date Taking? Authorizing Provider  Biotin 10 MG CAPS Take 1 capsule by mouth daily.   Yes Historical Provider, MD  ibuprofen (ADVIL,MOTRIN) 200 MG tablet Take 1,200 mg by mouth every 6 (six) hours as needed for moderate pain.   Yes Historical Provider, MD  HYDROcodone-acetaminophen (NORCO/VICODIN) 5-325 MG per tablet Take 1-2 tablets by mouth every 4 (four) hours as needed. Patient not taking: Reported on 06/03/2015 02/27/14   Delos Haring, PA-C  loperamide (IMODIUM) 2 MG capsule Take 1 capsule (2 mg total) by mouth 4 (four) times daily as needed for diarrhea or loose stools. Patient not taking: Reported on 06/03/2015 02/27/14   Delos Haring, PA-C  metroNIDAZOLE (FLAGYL) 500 MG tablet Take 1 tablet (500 mg total) by mouth 2 (two) times daily. Patient not taking: Reported on 06/03/2015 02/27/14   Delos Haring, PA-C  ondansetron (ZOFRAN) 4 MG tablet Take 1 tablet (4 mg total) by mouth every 6 (six) hours. Patient not taking: Reported on 06/03/2015 02/27/14   Delos Haring, PA-C  BP 103/61 mmHg  Pulse 59  Temp(Src) 97.8 F (36.6 C) (Oral)  Resp 16  SpO2 100%  LMP 05/17/2015 Physical Exam  Constitutional: She is oriented to person, place, and time. She appears well-developed and well-nourished. No distress.  HENT:  Head: Normocephalic and atraumatic.  Eyes: Conjunctivae and EOM are normal.  Cardiovascular: Normal rate and regular rhythm.   Pulmonary/Chest: Effort normal. No stridor. No respiratory distress. She has decreased breath sounds.  Abdominal: She exhibits no distension.  Musculoskeletal: She exhibits no edema.  Neurological: She is alert and oriented to person, place,  and time. No cranial nerve deficit.  Skin: Skin is warm and dry.  Psychiatric: She has a normal mood and affect.  Nursing note and vitals reviewed.   ED Course  Procedures (including critical care time) Labs Review Labs Reviewed  CBC  COMPREHENSIVE METABOLIC PANEL  MAGNESIUM  PROTIME-INR  TROPONIN I  D-DIMER, QUANTITATIVE (NOT AT [facility])    Imaging Review Dg Chest 2 View  06/03/2015  CLINICAL DATA:  Chest pain since yesterday, tingling in left arm. Weakness and dizziness. EXAM: CHEST  2 VIEW COMPARISON:  Chest x-ray dated 11/22/2011. FINDINGS: The heart size and mediastinal contours are within normal limits. Both lungs are clear. No pleural effusions seen. No pneumothorax seen. The visualized skeletal structures are unremarkable. IMPRESSION: Lungs are clear and there is no evidence of acute cardiopulmonary abnormality. Electronically Signed   By: Franki Cabot M.D.   On: 06/03/2015 11:04   I have personally reviewed and evaluated these images and lab results as part of my medical decision-making.   EKG Interpretation   Date/Time:  Saturday June 03 2015 09:51:35 EST Ventricular Rate:  64 PR Interval:  131 QRS Duration: 89 QT Interval:  386 QTC Calculation: 398 R Axis:   18 Text Interpretation:  Sinus rhythm Baseline wander in lead(s) V5 Sinus  rhythm incomplete study - v5 wandering, but grossly normal lead Abnormal  ekg Confirmed by Carmin Muskrat  MD ([id number]) on 06/03/2015 9:58:43 AM     Cardiac 65 sinus normal Pulse ox 99% room air normal  12:38 PM On repeat exam the patient is in no distress. Discussed all findings, including normal troponin, d-dimer, remarkable x-ray. Patient continues to have soreness across anterior chest. We discussed likelihood of soft tissue inflammation, particularly given her smoking history.  MDM  44 year old female presents with days of ongoing intermittent chest discomfort. Patient's description of anterior chest soreness, smoking  history suggests bronchitis versus chest wall inflammation. Patient's risk profile otherwise is low, and with normal troponin, nonischemic EKG, after days of symptoms, there is low suspicion for atypical ACS. Normal d-dimer significance for low suspicion of dissection or pulmonary embolism or Patient also has no physical exam findings consistent with these. Patient had some improvement in pain control here, was discharged with inflammatory, analgesia, to follow-up with primary care.  Carmin Muskrat, MD 06/03/15 1240

## 2015-06-03 NOTE — Discharge Instructions (Signed)
As discussed, your evaluation today has been largely reassuring.  But, it is important that you monitor your condition carefully, and do not hesitate to return to the ED if you develop new, or concerning changes in your condition.  Otherwise, please follow-up with your physician for appropriate ongoing care.   Chest Wall Pain Chest wall pain is pain in or around the bones and muscles of your chest. Sometimes, an injury causes this pain. Sometimes, the cause may not be known. This pain may take several weeks or longer to get better. HOME CARE INSTRUCTIONS  Pay attention to any changes in your symptoms. Take these actions to help with your pain:   Rest as told by your health care provider.   Avoid activities that cause pain. These include any activities that use your chest muscles or your abdominal and side muscles to lift heavy items.   If directed, apply ice to the painful area:  Put ice in a plastic bag.  Place a towel between your skin and the bag.  Leave the ice on for 20 minutes, 2-3 times per day.  Take over-the-counter and prescription medicines only as told by your health care provider.  Do not use tobacco products, including cigarettes, chewing tobacco, and e-cigarettes. If you need help quitting, ask your health care provider.  Keep all follow-up visits as told by your health care provider. This is important. SEEK MEDICAL CARE IF:  You have a fever.  Your chest pain becomes worse.  You have new symptoms. SEEK IMMEDIATE MEDICAL CARE IF:  You have nausea or vomiting.  You feel sweaty or light-headed.  You have a cough with phlegm (sputum) or you cough up blood.  You develop shortness of breath.   This information is not intended to replace advice given to you by your health care provider. Make sure you discuss any questions you have with your health care provider.   Document Released: 06/17/2005 Document Revised: 03/08/2015 Document Reviewed:  09/12/2014 Elsevier Interactive Patient Education [employer].

## 2016-03-25 ENCOUNTER — Ambulatory Visit (HOSPITAL_COMMUNITY)
Admission: EM | Admit: 2016-03-25 | Discharge: 2016-03-25 | Disposition: A | Discharge status: Home / Self Care | Payer: 59 | Attending: Family Medicine | Admitting: Family Medicine

## 2016-03-25 ENCOUNTER — Encounter (HOSPITAL_COMMUNITY): Payer: Self-pay | Admitting: Emergency Medicine

## 2016-03-25 DIAGNOSIS — F4310 Post-traumatic stress disorder, unspecified: Secondary | ICD-10-CM | POA: Insufficient documentation

## 2016-03-25 DIAGNOSIS — F329 Major depressive disorder, single episode, unspecified: Secondary | ICD-10-CM | POA: Diagnosis not present

## 2016-03-25 DIAGNOSIS — F17210 Nicotine dependence, cigarettes, uncomplicated: Secondary | ICD-10-CM | POA: Insufficient documentation

## 2016-03-25 DIAGNOSIS — L0291 Cutaneous abscess, unspecified: Secondary | ICD-10-CM | POA: Diagnosis not present

## 2016-03-25 DIAGNOSIS — L02211 Cutaneous abscess of abdominal wall: Secondary | ICD-10-CM | POA: Diagnosis not present

## 2016-03-25 DIAGNOSIS — B9689 Other specified bacterial agents as the cause of diseases classified elsewhere: Secondary | ICD-10-CM | POA: Diagnosis not present

## 2016-03-25 MED ORDER — FLUCONAZOLE 200 MG PO TABS: 200.0000 mg | ORAL_TABLET | Freq: Every day | ORAL | 0 refills | Status: AC

## 2016-03-25 MED ORDER — SULFAMETHOXAZOLE-TRIMETHOPRIM 800-160 MG PO TABS: 1.0000 | ORAL_TABLET | Freq: Two times a day (BID) | ORAL | 0 refills | Status: AC

## 2016-03-25 MED ORDER — TRAMADOL HCL 50 MG PO TABS: 50.0000 mg | ORAL_TABLET | Freq: Four times a day (QID) | ORAL | 0 refills | Status: DC | PRN

## 2016-03-25 NOTE — ED Provider Notes (Signed)
CSN: [id number]     Arrival date & time 03/25/16  1939 History   First MD Initiated Contact with Patient 03/25/16 2025     Chief Complaint  Patient presents with  . 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 She states that she has had multiple abscesses in the past has never been told she had MRSA. Does not recall what antibiotic she has been treated with in the past she does not have a primary care provider at this time. Past Medical History:  Diagnosis Date  . Depression   . PTSD (post-traumatic stress disorder)    Past Surgical History:  Procedure Laterality Date  . APPENDECTOMY    . DILATION AND EVACUATION  05/12/2011   Procedure: DILATATION AND EVACUATION (D&E);  Surgeon: Florian Buff, MD;  Location: [facility];  Service: Gynecology;  Laterality: N/A;   Family History  Problem Relation Age of Onset  . Diabetes Mother   . Asthma Mother    Social History  Substance Use Topics  . Smoking status: Current Every Day Smoker    Packs/day: 0.50    Types: Cigarettes  . Smokeless tobacco: Not on file  . Alcohol use No   OB History    Gravida Para Term Preterm AB Living   8 4 4  0 2 4   SAB TAB Ectopic Multiple Live Births   1 1 0 0       Review of Systems  Denies: HEADACHE, NAUSEA, ABDOMINAL PAIN, CHEST PAIN, CONGESTION, DYSURIA, SHORTNESS OF BREATH  Allergies  Bee venom and Morphine  Home Medications   Prior to Admission medications   Medication Sig Start Date End Date Taking? Authorizing Provider  Biotin 10 MG CAPS Take 1 capsule by mouth daily.   Yes Historical Provider, MD  fluconazole (DIFLUCAN) 200 MG tablet Take 1 tablet (200 mg total) by mouth daily. 03/25/16 03/28/16  Konrad Felix, PA   HYDROcodone-acetaminophen (NORCO/VICODIN) 5-325 MG tablet Take 1 tablet by mouth every 6 (six) hours as needed for severe pain. 06/03/15   Carmin Muskrat, MD  sulfamethoxazole-trimethoprim (BACTRIM DS,SEPTRA DS) 800-160 MG tablet Take 1 tablet by mouth 2 (two) times daily. 03/25/16 04/01/16  Konrad Felix, PA  traMADol (ULTRAM) 50 MG tablet Take 1 tablet (50 mg total) by mouth every 6 (six) hours as needed. 03/25/16   Konrad Felix, PA   Meds Ordered and Administered this Visit  Medications - No data to display  BP 129/82 (BP Location: Left Arm)   Pulse 82   Temp 98.1 F (36.7 C) (Oral)   Resp 12   SpO2 99%  No data found.   Physical Exam NURSES NOTES AND VITAL SIGNS REVIEWED. CONSTITUTIONAL: Well developed, well nourished, no acute distress HEENT: normocephalic, atraumatic EYES: Conjunctiva normal NECK:normal ROM, supple, no adenopathy PULMONARY:No respiratory distress, normal effort ABDOMINAL: Soft, ND, NT BS+, No CVAT MUSCULOSKELETAL: Normal ROM of all extremities,  SKIN: warm and dry without rash, left abdomen there is a 1 cm gaping wound expressing a small amount of pus. No active cellulitis noted. PSYCHIATRIC: Mood and affect, behavior are normal  Urgent Care Course   Clinical Course   Wound culture has been obtained. Procedures (including critical care time)  Labs Review Labs Reviewed - No data to display  Imaging Review No results  found.   Visual Acuity Review  Right Eye Distance:   Left Eye Distance:   Bilateral Distance:    Right Eye Near:   Left Eye Near:    Bilateral Near:        Prescription for Bactrim DS is provided. Return to work note is given. MDM   1. Abscess     Patient is reassured that there are no issues that require transfer to higher level of care at this time or additional tests. Patient is advised to continue home symptomatic treatment. Patient is advised that if there are new or worsening symptoms to attend the emergency  department, contact primary care provider, or return to UC. Instructions of care provided discharged home in stable condition.    THIS NOTE WAS GENERATED USING A VOICE RECOGNITION SOFTWARE PROGRAM. ALL REASONABLE EFFORTS  WERE MADE TO PROOFREAD THIS DOCUMENT FOR ACCURACY.  I have verbally reviewed the discharge instructions with the patient. A printed AVS was given to the patient.  All questions were answered prior to discharge.      Konrad Felix, [place] 03/25/16 2058

## 2016-03-25 NOTE — ED Triage Notes (Signed)
The patient presented to the [facility] with a complaint of a possible ruptured abscess on the left side of her torso.

## 2016-03-25 NOTE — ED Notes (Signed)
The patient's wound covered with telfa dressing and tape per provider's verbal order.

## 2016-03-28 LAB — AEROBIC CULTURE W GRAM STAIN (SUPERFICIAL SPECIMEN): Culture: NORMAL

## 2016-03-28 LAB — AEROBIC CULTURE  (SUPERFICIAL SPECIMEN)

## 2018-11-04 ENCOUNTER — Other Ambulatory Visit: Payer: Self-pay

## 2018-11-04 ENCOUNTER — Emergency Department (HOSPITAL_COMMUNITY): Payer: 59

## 2018-11-04 ENCOUNTER — Emergency Department (HOSPITAL_COMMUNITY)
Admission: EM | Admit: 2018-11-04 | Discharge: 2018-11-05 | Disposition: A | Discharge status: Home / Self Care | Payer: 59 | Attending: Emergency Medicine | Admitting: Emergency Medicine

## 2018-11-04 ENCOUNTER — Encounter (HOSPITAL_COMMUNITY): Payer: Self-pay | Admitting: Emergency Medicine

## 2018-11-04 DIAGNOSIS — N898 Other specified noninflammatory disorders of vagina: Secondary | ICD-10-CM | POA: Diagnosis not present

## 2018-11-04 DIAGNOSIS — F17210 Nicotine dependence, cigarettes, uncomplicated: Secondary | ICD-10-CM | POA: Diagnosis not present

## 2018-11-04 DIAGNOSIS — B9689 Other specified bacterial agents as the cause of diseases classified elsewhere: Secondary | ICD-10-CM | POA: Diagnosis not present

## 2018-11-04 DIAGNOSIS — N760 Acute vaginitis: Secondary | ICD-10-CM | POA: Diagnosis not present

## 2018-11-04 DIAGNOSIS — N83291 Other ovarian cyst, right side: Secondary | ICD-10-CM | POA: Diagnosis not present

## 2018-11-04 DIAGNOSIS — R102 Pelvic and perineal pain: Secondary | ICD-10-CM | POA: Diagnosis present

## 2018-11-04 DIAGNOSIS — L292 Pruritus vulvae: Secondary | ICD-10-CM | POA: Insufficient documentation

## 2018-11-04 LAB — URINALYSIS, ROUTINE W REFLEX MICROSCOPIC
Bilirubin Urine: NEGATIVE
Glucose, UA: NEGATIVE mg/dL
Hgb urine dipstick: NEGATIVE
Ketones, ur: NEGATIVE mg/dL
Leukocytes,Ua: NEGATIVE
Nitrite: NEGATIVE
Protein, ur: NEGATIVE mg/dL
Specific Gravity, Urine: 1.025 (ref 1.005–1.030)
pH: 6 (ref 5.0–8.0)

## 2018-11-04 LAB — WET PREP, GENITAL
Sperm: NONE SEEN
Trich, Wet Prep: NONE SEEN
Yeast Wet Prep HPF POC: NONE SEEN

## 2018-11-04 MED ORDER — AZITHROMYCIN 250 MG PO TABS
1000.0000 mg | ORAL_TABLET | Freq: Once | ORAL | Status: AC
  Administered 2018-11-04: 23:00:00 1000 mg via ORAL
  Filled 2018-11-04: qty 4

## 2018-11-04 MED ORDER — LIDOCAINE HCL (PF) 1 % IJ SOLN
INTRAMUSCULAR | Status: AC
  Administered 2018-11-04: 5 mL
  Filled 2018-11-04: qty 5

## 2018-11-04 MED ORDER — CEFTRIAXONE SODIUM 250 MG IJ SOLR
250.0000 mg | Freq: Once | INTRAMUSCULAR | Status: AC
  Administered 2018-11-04: 23:00:00 250 mg via INTRAMUSCULAR
  Filled 2018-11-04: qty 250

## 2018-11-04 MED ORDER — METRONIDAZOLE 500 MG PO TABS: 500.0000 mg | ORAL_TABLET | Freq: Two times a day (BID) | ORAL | 0 refills | Status: DC
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
## 2018-11-04 NOTE — Discharge Instructions (Addendum)
You were seen in the ED for vaginal irritation, itching and discharge.  Skin irritation and itching may be from recent sexual encounter or most likely from switch of your detergent that is causing irritation and dryness to the skin.  Menopause can also cause skin dryness and itching.  We will treat this conservatively.  Do warm sits baths 2-3 times a day, immediately after tap dry and apply a thin layer of Vaseline to the area to provide moisture.  You can take 25 mg of diphenhydramine (Benadryl) every 6-8 hours for itching during the day.    Vaginal discharge is likely from bacterial vaginosis.  We will treat this with Flagyl.  Do not drink alcohol with this medicine as it will call severe nausea, vomiting and abdominal pain.  You were treated for gonorrhea and chlamydia here in the results for these tests are pending.  You will be notified via phone if they are positive.  Do not have sexual encounters until your symptoms have completely resolved and for the next 7 days after treatment.  Return to the ED if there is any painful ulcer or lesions in the genital area, fever, chills, abdominal pain, flank pain, abnormal vaginal bleeding or worsening discharge.  Follow up with OBGYN for persistent symptoms

## 2018-11-04 NOTE — ED Triage Notes (Signed)
Pt having vaginal burning sensation and lower abd pain for a week. Pt states she had a problem with a broken condom prior to this.

## 2018-11-05 LAB — URINE CULTURE

## 2018-11-05 LAB — GC/CHLAMYDIA PROBE AMP (~~LOC~~) NOT AT ARMC
Chlamydia: NEGATIVE
Neisseria Gonorrhea: NEGATIVE

## 2018-11-05 NOTE — ED Provider Notes (Signed)
12:44 AM Handoff from Navar[place] PA-C at shift change.   Patient with right-sided pelvic pain.  Patient with itching.  Pending ultrasound.  Ultrasound results reviewed.  Patient notified of right-sided ovarian cyst without other complicating factors or other ultrasound findings.  She will go home on metronidazole.  Discussed bacterial vaginosis diagnosis.   BP 118/73 (BP Location: Right Arm)   Pulse (!) 56   Temp 98 F (36.7 C) (Oral)   Resp 16   Ht 5\' 3"  (1.6 m)   Wt 74.8 kg   LMP 05/17/2015   SpO2 99%   BMI 29.23 kg/m     Carlisle Cater, PA-C 11/05/18 0045    Quintella Reichert, MD 11/06/18 [phone]

## 2018-11-05 NOTE — ED Notes (Signed)
Pt discharged from ED; instructions provided and scripts given; Pt encouraged to return to ED if symptoms worsen and to f/u with PCP; Pt verbalized understanding of all instructions 

## 2021-12-26 ENCOUNTER — Emergency Department (HOSPITAL_COMMUNITY): Payer: Self-pay

## 2021-12-26 ENCOUNTER — Emergency Department (HOSPITAL_COMMUNITY)
Admission: EM | Admit: 2021-12-26 | Discharge: 2021-12-26 | Disposition: A | Discharge status: Home / Self Care | Payer: Self-pay | Attending: Emergency Medicine | Admitting: Emergency Medicine

## 2021-12-26 ENCOUNTER — Encounter (HOSPITAL_COMMUNITY): Payer: Self-pay | Admitting: Pharmacy Technician

## 2021-12-26 ENCOUNTER — Other Ambulatory Visit: Payer: Self-pay

## 2021-12-26 DIAGNOSIS — R0602 Shortness of breath: Secondary | ICD-10-CM | POA: Insufficient documentation

## 2021-12-26 DIAGNOSIS — F419 Anxiety disorder, unspecified: Secondary | ICD-10-CM | POA: Insufficient documentation

## 2021-12-26 DIAGNOSIS — R079 Chest pain, unspecified: Secondary | ICD-10-CM | POA: Insufficient documentation

## 2021-12-26 DIAGNOSIS — R45851 Suicidal ideations: Secondary | ICD-10-CM | POA: Insufficient documentation

## 2021-12-26 DIAGNOSIS — E1165 Type 2 diabetes mellitus with hyperglycemia: Secondary | ICD-10-CM | POA: Insufficient documentation

## 2021-12-26 DIAGNOSIS — R42 Dizziness and giddiness: Secondary | ICD-10-CM | POA: Insufficient documentation

## 2021-12-26 HISTORY — DX: Type 2 diabetes mellitus without complications: E11.9

## 2021-12-26 LAB — BASIC METABOLIC PANEL
Anion gap: 8 (ref 5–15)
BUN: 15 mg/dL (ref 6–20)
CO2: 25 mmol/L (ref 22–32)
Calcium: 10 mg/dL (ref 8.9–10.3)
Chloride: 108 mmol/L (ref 98–111)
Creatinine, Ser: 0.81 mg/dL (ref 0.44–1.00)
GFR, Estimated: >60 mL/min (ref >60)
Glucose, Bld: 107 mg/dL — ABNORMAL HIGH (ref 70–99)
Potassium: 4.6 mmol/L (ref 3.5–5.1)
Sodium: 141 mmol/L (ref 135–145)

## 2021-12-26 LAB — CBC WITH DIFFERENTIAL/PLATELET
Abs Immature Granulocytes: 0.01 10*3/uL (ref 0.00–0.07)
Basophils Absolute: 0 10*3/uL (ref 0.0–0.1)
Basophils Relative: 1 %
Eosinophils Absolute: 0.1 10*3/uL (ref 0.0–0.5)
Eosinophils Relative: 1 %
HCT: 48.8 % — ABNORMAL HIGH (ref 36.0–46.0)
Hemoglobin: 15.7 g/dL — ABNORMAL HIGH (ref 12.0–15.0)
Immature Granulocytes: 0 %
Lymphocytes Relative: 55 %
Lymphs Abs: 2.9 10*3/uL (ref 0.7–4.0)
MCH: 28.1 pg (ref 26.0–34.0)
MCHC: 32.2 g/dL (ref 30.0–36.0)
MCV: 87.3 fL (ref 80.0–100.0)
Monocytes Absolute: 0.4 10*3/uL (ref 0.1–1.0)
Monocytes Relative: 8 %
Neutro Abs: 1.8 10*3/uL (ref 1.7–7.7)
Neutrophils Relative %: 35 %
Platelets: 192 10*3/uL (ref 150–400)
RBC: 5.59 MIL/uL — ABNORMAL HIGH (ref 3.87–5.11)
RDW: 13.3 % (ref 11.5–15.5)
WBC: 5.2 10*3/uL (ref 4.0–10.5)
nRBC: 0 % (ref 0.0–0.2)

## 2021-12-26 LAB — D-DIMER, QUANTITATIVE: D-Dimer, Quant: <0.27 ug/mL-FEU (ref 0.00–0.50)

## 2021-12-26 LAB — BRAIN NATRIURETIC PEPTIDE: B Natriuretic Peptide: 16 pg/mL (ref 0.0–100.0)

## 2021-12-26 LAB — TROPONIN I (HIGH SENSITIVITY)
Troponin I (High Sensitivity): 3 ng/L (ref <18)
Troponin I (High Sensitivity): 3 ng/L (ref <18)

## 2021-12-26 MED ORDER — LORAZEPAM 1 MG PO TABS
1.0000 mg | ORAL_TABLET | Freq: Once | ORAL | Status: AC
  Administered 2021-12-26: 1 mg via ORAL
  Filled 2021-12-26: qty 1

## 2021-12-26 NOTE — Discharge Instructions (Addendum)
You came to the emergency department today to be evaluated for your chest pain and suicidal ideations.  Your suicidal ideations are passive and you are found to not be arrested yourself today.  Please follow-up with behavioral health urgent care as needed for psychiatric emergencies.  Please use the information on this paperwork to schedule follow-up appointment with a psychiatrist/counselor.  Your chest pain work-up was reassuring that you are not having an acute heart attack or blood clot in your lungs today.  I have placed a referral to cardiology and they should contact you in the next 2-3 business days.  Please call to schedule an appointment if you do not hear from them in that time.  Get help right away if: Your chest pain gets worse. You have a cough that gets worse, or you cough up blood. You have severe pain in your abdomen. You faint. You have sudden, unexplained chest discomfort. You have sudden, unexplained discomfort in your arms, back, neck, or jaw. You have shortness of breath at any time. You suddenly start to sweat, or your skin gets clammy. You feel nausea or you vomit. You suddenly feel lightheaded or dizzy. You have severe weakness, or unexplained weakness or fatigue. Your heart begins to beat quickly, or it feels like it is skipping beats. You have thoughts of hurting yourself with an active plan.  You try to hurt yourself.  You want to hurt other people.  You see or hear things that are not there.

## 2021-12-26 NOTE — ED Provider Notes (Signed)
[facility] EMERGENCY DEPARTMENT Provider Note   CSN: [id number] Arrival date & time: 12/26/21  1110     History  Chief Complaint  Patient presents with   Chest Pain    [name] is a 51 y.o. female with a past medical history of diabetes mellitus, PTSD, depression, status post appendectomy.  Patient presents emerged part with a chief complaint of chest pain and shortness of breath.  Patient reports that chest pain started morning at 3 AM.  Patient states that she was laying down watching TV when chest pain started.  Pain is located as a band across her entire chest.  Patient describes pain as a pressure.  At present patient rates pressure 8/10 on the pain scale.  Patient reports the pain has been constant since starting at 3 AM however it does wax and wane in intensity.  Patient reports that it is worse when she hyperventilates.  No alleviating factors.  Patient endorses associated shortness of breath.  Patient had had 1 episode of lightheadedness when she got up to go to the bathroom after hyperventilating.  Denies any fever, chills, cough, hemoptysis, leg swelling or tenderness, abdominal pain, nausea, vomiting, diaphoresis, history of DVT/PE, surgery in the last 12 weeks, history of malignancy.  Patient does endorse 15 years tobacco history.  Smoked half pack a day over that time.  Patient quit 1 month prior.  Patient reports mother had heart problems and died before the age of 36.  Additionally patient reports increased depression over the last few weeks.  Patient reports that she has had some suicidal ideations over this time.  Patient has not acted on the suicidal ideations and does not have a plan to do so.  Denies any homicidal ideations, auditory hallucinations, or visual hallucinations.  Patient reports that she is not on any medication for depression and does not have a therapist/counselor in the outpatient setting.  Chest Pain Associated symptoms:  shortness of breath   Associated symptoms: no abdominal pain, no back pain, no cough, no dizziness, no fever, no headache, no nausea, no palpitations and no vomiting        Home Medications Prior to Admission medications   Medication Sig Start Date End Date Taking? Authorizing Provider  metroNIDAZOLE (FLAGYL) 500 MG tablet Take 1 tablet (500 mg total) by mouth 2 (two) times daily. 11/04/18   Kinnie Feil, PA-C      Allergies    Bee venom and Morphine    Review of Systems   Review of Systems  Constitutional:  Negative for chills and fever.  Eyes:  Negative for visual disturbance.  Respiratory:  Positive for shortness of breath. Negative for cough.   Cardiovascular:  Positive for chest pain. Negative for palpitations and leg swelling.  Gastrointestinal:  Negative for abdominal pain, nausea and vomiting.  Genitourinary:  Negative for difficulty urinating and dysuria.  Musculoskeletal:  Negative for back pain and neck pain.  Skin:  Negative for color change and rash.  Neurological:  Positive for light-headedness. Negative for dizziness, syncope and headaches.  Psychiatric/Behavioral:  Negative for confusion.     Physical Exam Updated Vital Signs BP (!) 124/99 (BP Location: Right Arm)   Pulse 67   Temp 98.6 F (37 C) (Oral)   Resp 16   LMP 05/17/2015   SpO2 98%  Physical Exam Vitals and nursing note reviewed.  Constitutional:      General: She is not in acute distress.    Appearance: She is  not ill-appearing, toxic-appearing or diaphoretic.  HENT:     Head: Normocephalic.  Eyes:     General: No scleral icterus.       Right eye: No discharge.        Left eye: No discharge.  Cardiovascular:     Rate and Rhythm: Normal rate.     Pulses:          Radial pulses are 2+ on the right side and 2+ on the left side.     Heart sounds: Normal heart sounds. Heart sounds not distant. No murmur heard. Pulmonary:     Effort: Pulmonary effort is normal. No tachypnea, bradypnea or  prolonged expiration.     Breath sounds: Normal breath sounds. No stridor.     Comments: Speaks in full complete sentences without difficulty Abdominal:     General: Abdomen is flat.     Palpations: Abdomen is soft. There is no mass or pulsatile mass.     Tenderness: There is no abdominal tenderness. There is no guarding or rebound.  Musculoskeletal:     Right lower leg: No swelling, deformity, lacerations, tenderness or bony tenderness. No edema.     Left lower leg: No swelling, deformity, lacerations, tenderness or bony tenderness. No edema.     Comments: No swelling or tenderness to bilateral lower extremities  Skin:    General: Skin is warm and dry.  Neurological:     General: No focal deficit present.     Mental Status: She is alert and oriented to person, place, and time.     GCS: GCS eye subscore is 4. GCS verbal subscore is 5. GCS motor subscore is 6.  Psychiatric:        Attention and Perception: She is attentive. She does not perceive auditory or visual hallucinations.        Mood and Affect: Mood is anxious. Affect is tearful.        Behavior: Behavior is cooperative.        Thought Content: Thought content is not paranoid or delusional. Thought content includes suicidal ideation. Thought content does not include homicidal ideation. Thought content does not include homicidal or suicidal plan.     ED Results / Procedures / Treatments   Labs (all labs ordered are listed, but only abnormal results are displayed) Labs Reviewed  BASIC METABOLIC PANEL - Abnormal; Notable for the following components:      Result Value   Glucose, Bld 107 (*)    All other components within normal limits  CBC WITH DIFFERENTIAL/PLATELET - Abnormal; Notable for the following components:   RBC 5.59 (*)    Hemoglobin 15.7 (*)    HCT 48.8 (*)    All other components within normal limits  BRAIN NATRIURETIC PEPTIDE  D-DIMER, QUANTITATIVE  TROPONIN I (HIGH SENSITIVITY)  TROPONIN I (HIGH  SENSITIVITY)    EKG EKG Interpretation  Date/Time:  Wednesday December 26 2021 11:23:06 EDT Ventricular Rate:  63 PR Interval:  130 QRS Duration: 82 QT Interval:  388 QTC Calculation: 397 R Axis:   46 Text Interpretation: Normal sinus rhythm Normal ECG When compared with ECG of 03-Jun-2015 09:51, PREVIOUS ECG IS PRESENT No significant change was found Confirmed by Ezequiel Essex [phone]) on 12/26/2021 2:35:46 PM  Radiology DG Chest 2 View  Result Date: 12/26/2021 CLINICAL DATA:  Chest pain and short of breath EXAM: CHEST - 2 VIEW COMPARISON:  06/03/2015 FINDINGS: The heart size and mediastinal contours are within normal limits. Both lungs are clear. The  visualized skeletal structures are unremarkable. IMPRESSION: No active cardiopulmonary disease. Electronically Signed   By: Franchot Gallo M.D.   On: 12/26/2021 12:59    Procedures Procedures    Medications Ordered in ED Medications - No data to display  ED Course/ Medical Decision Making/ A&P                           Medical Decision Making Amount and/or Complexity of Data Reviewed Labs: ordered.  Risk Prescription drug management.   Alert 51 year old female in no acute distress, nontoxic-appearing.  Patient does appear tearful and anxious.  Presents to the emergency department with a complaint of chest pain, shortness of breath, and suicidal ideations.  Information obtained from patient.  I reviewed patient's past medical records including previous provider notes, labs, and imaging.  Patient has medical history as outlined in HPI which complicates her care.  Patient reports increased depression due to "events with her son."  Patient endorses suicidal ideations but denies any plan of suicide or any intent to carry out a plan.  Patient also denies any homicidal ideations, auditory hallucinations, or visual hallucinations.  Patient is expressing passive suicidality at this time.  No criteria for IVC or inpatient admission.  We  will give patient information to follow-up with [facility] and outpatient providers.  We will give patient Ativan at this time to help with her anxiety.  With reports of chest pain will initiate ACS work-up.  Cannot rule out PE based on PERC criteria due to patient's age.  Patient is low risk for PE and therefore we will obtain D-dimer for rule out.  I personally viewed and interpreted patient's EKG.  Tracing shows sinus rhythm.  I personally viewed and interpreted patient's chest x-ray.  Imaging shows no active cardiopulmonary disease.  I personally viewed interpret patient's lab results.  Pertinent findings include: -Hemoconcentration likely secondary to dehydration.  Patient able to tolerate p.o. intake. -Troponin 3 -D-dimer within normal limits -BMP and BNP unremarkable  Patient has heart score of 4 placing her in a moderate risk category.  Will check delta troponin.  If unremarkable will discharge patient to follow-up with cardiology referral.  Patient care transferred to PA Gieple at the end of my shift. Patient presentation, ED course, and plan of care discussed with review of all pertinent labs and imaging. Please see his/her note for further details regarding further ED course and disposition.         Final Clinical Impression(s) / ED Diagnoses Final diagnoses:  None    Rx / DC Orders ED Discharge Orders     None         Loni Beckwith, PA-C 12/26/21 1551    Ezequiel Essex, MD 12/26/21 1706

## 2021-12-26 NOTE — ED Provider Triage Note (Signed)
Emergency Medicine Provider Triage Evaluation Note  [name] , a 51 y.o. female  was evaluated in triage.  Pt complains of CP. Report CP and SOB since 3am.  Nausea, and dry cough.  PERC negative. No fever, hemoptysis or wheezing.  Feeling anxious  Review of Systems  Positive: As above Negative: As above  Physical Exam  BP (!) 124/99 (BP Location: Right Arm)   Pulse 67   Temp 98.6 F (37 C) (Oral)   Resp 16   LMP 05/17/2015   SpO2 98%  Gen:   Awake, no distress   Resp:  Normal effort  MSK:   Moves extremities without difficulty  Other:    Medical Decision Making  Medically screening exam initiated at 12:18 PM.  Appropriate orders placed.  [name] was informed that the remainder of the evaluation will be completed by another provider, this initial triage assessment does not replace that evaluation, and the importance of remaining in the ED until their evaluation is complete.     Domenic Moras, PA-C 12/26/21 1224

## 2021-12-26 NOTE — ED Provider Notes (Signed)
Signout from [employer] at shift change. Briefly, patient presents for chest pain.  Work-up to this point is being reassuring.  Also reports passive SI, worsening depression without plan.   Plan: Awaiting D-dimer and second troponin.  Discharge with referrals if reassuring.   5:28 PM Reassessment performed. Patient appears comfortable.  She is lying in hallway bed.  Labs and imaging personally reviewed and interpreted including: CBC with normal white blood cell count, hemoglobin 15.7 otherwise unremarkable; BMP with mild hyperglycemia at 107 otherwise unremarkable; negative D-dimer; Trop 3>>3; BNP normal.     Reviewed additional pertinent lab work and imaging with patient at bedside.   Most current vital signs reviewed and are as follows: BP 124/89   Pulse 62   Temp 98.6 F (37 C) (Oral)   Resp 20   LMP 05/17/2015   SpO2 98%   Plan: d/c home per previous plan   Return and follow-up instructions: I encouraged patient to return to ED with severe chest pain, especially if the pain is crushing or pressure-like and spreads to the arms, back, neck, or jaw, or if they have associated sweating, vomiting, or shortness of breath with the pain, or significant pain with activity.    Carlisle Cater, PA-C 12/26/21 1731    Regan Lemming, MD 12/26/21 [phone]

## 2021-12-26 NOTE — ED Triage Notes (Signed)
Pt here with reports of chest pain and shob since 0300 today. Pt states it feels like someone is standing on her chest.

## 2022-01-29 ENCOUNTER — Ambulatory Visit: Payer: Self-pay | Admitting: Internal Medicine

## 2023-04-14 ENCOUNTER — Encounter (HOSPITAL_COMMUNITY): Payer: Self-pay | Admitting: Emergency Medicine

## 2023-04-14 ENCOUNTER — Ambulatory Visit (HOSPITAL_COMMUNITY)
Admission: EM | Admit: 2023-04-14 | Discharge: 2023-04-14 | Disposition: A | Discharge status: Home / Self Care | Payer: Self-pay | Attending: Internal Medicine | Admitting: Internal Medicine

## 2023-04-14 DIAGNOSIS — R202 Paresthesia of skin: Secondary | ICD-10-CM | POA: Insufficient documentation

## 2023-04-14 DIAGNOSIS — N898 Other specified noninflammatory disorders of vagina: Secondary | ICD-10-CM | POA: Insufficient documentation

## 2023-04-14 MED ORDER — FLUCONAZOLE 150 MG PO TABS: 150.0000 mg | ORAL_TABLET | Freq: Every day | ORAL | 1 refills | Status: DC

## 2023-04-14 NOTE — Discharge Instructions (Addendum)
We have tested you today and we will contact you if we need to arrange additional treatment based on your testing. Abstain from sex until you receive your final results.  Use a condom for the sexual encounter.  Use hypoallergenic soaps and detergents and wear loosefitting cotton underwear.  If you have any worsening or changing symptoms including abnormal discharge, pelvic pain, abdominal pain, fever, nausea, vomiting you should be reevaluated.   Diflucan called in for treatment of possible yeast infection.  Keep a log of your hand symptoms and if they are persistent, then may need to see a orthopedist with hand speciality.

## 2023-04-14 NOTE — ED Triage Notes (Signed)
Pt c/o of hand numbness and tingling that is pain and started tody=ay   Pt also worried about STD. She has Vaginal itchiness and watery discharge that started about 5 days ago

## 2023-04-14 NOTE — ED Provider Notes (Signed)
[facility]    CSN: [id number] Arrival date & time: 04/14/23  1535      History   Chief Complaint Chief Complaint  Patient presents with   Hand Problem   Exposure to STD    HPI [name] is a 52 y.o. female.   52 yr old female who presents to urgent care with multiple complaints including hand tingling and vaginal symptoms. Her fingers feel like she has gloves on and feeling like they are waking up after being asleep. Today was the first time it happened and it kept coming and going. Has stopped since not at work. Did noticed her hands were very cold over the weekend but didn't have the tingling.  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   Sexual intercourse 5 days ago and 3 days ago developed itching and watery discharge from her vaginal. Only has one partner for years. Feels like on fire in the vaginal area. Denies pelvic pain or other symptoms associated with this.    Exposure to STD Pertinent negatives include no chest pain, no abdominal pain and no shortness of breath.    Past Medical History:  Diagnosis Date   Back pain    BV (bacterial vaginosis)    Chest pain    Depression    Diabetes mellitus without complication (HCC)    PID (acute pelvic inflammatory disease)    PTSD (post-traumatic stress disorder)     Patient Active Problem List   Diagnosis Date Noted   Lipoma 04/23/2007   OBESITY 04/23/2007   TOBACCO ABUSE 04/23/2007   DEPRESSION 04/23/2007   Allergic rhinitis 04/23/2007    Past Surgical History:  Procedure Laterality Date   APPENDECTOMY     DILATION AND EVACUATION  05/12/2011   Procedure: DILATATION AND EVACUATION (D&E);  Surgeon: Lazaro Arms, MD;  Location: [facility];  Service: Gynecology;  Laterality: N/A;    OB History     Gravida  8   Para  4   Term  4   Preterm  0   AB  2   Living  4      SAB  1   IAB  1   Ectopic  0   Multiple  0   Live Births                Home Medications    Prior to Admission medications   Not on File    Family History Family History  Problem Relation Age of Onset   Diabetes Mother    Asthma Mother     Social History Social History   Tobacco Use   Smoking status: Every Day    Current packs/day: 0.50    Types: Cigarettes  Substance Use Topics   Alcohol use: No   Drug use: No     Allergies   Bee venom and Morphine   Review of Systems Review of Systems  Constitutional:  Negative for chills and fever.  HENT:  Negative for ear pain and sore throat.   Eyes:  Negative for pain and visual disturbance.  Respiratory:  Negative for cough and shortness of breath.   Cardiovascular:  Negative for chest pain and palpitations.  Gastrointestinal:  Negative for abdominal pain and vomiting.  Genitourinary:  Positive for vaginal discharge and vaginal pain. Negative for dysuria and hematuria.  Musculoskeletal:  Negative for arthralgias and back pain.  Skin:  Negative for color change  and rash.  Neurological:  Negative for seizures and syncope.       Bilateral hand tingling  All other systems reviewed and are negative.    Physical Exam Triage Vital Signs ED Triage Vitals  Encounter Vitals Group     BP 04/14/23 1620 112/76     Systolic BP Percentile --      Diastolic BP Percentile --      Pulse Rate 04/14/23 1620 (!) 52     Resp 04/14/23 1620 16     Temp 04/14/23 1620 98.2 F (36.8 C)     Temp Source 04/14/23 1620 Oral     SpO2 04/14/23 1620 96 %     Weight --      Height --      Head Circumference --      Peak Flow --      Pain Score 04/14/23 1617 10     Pain Loc --      Pain Education --      Exclude from Growth Chart --    No data found.  Updated Vital Signs BP 112/76 (BP Location: Left Arm)   Pulse (!) 52   Temp 98.2 F (36.8 C) (Oral)   Resp 16   SpO2 96%   Breastfeeding No   Visual Acuity Right Eye Distance:   Left Eye Distance:   Bilateral Distance:    Right Eye Near:    Left Eye Near:    Bilateral Near:     Physical Exam Vitals and nursing note reviewed.  Constitutional:      General: She is not in acute distress.    Appearance: She is well-developed.  HENT:     Head: Normocephalic and atraumatic.  Eyes:     Conjunctiva/sclera: Conjunctivae normal.  Cardiovascular:     Rate and Rhythm: Normal rate and regular rhythm.     Heart sounds: No murmur heard. Pulmonary:     Effort: Pulmonary effort is normal. No respiratory distress.     Breath sounds: Normal breath sounds.  Abdominal:     Palpations: Abdomen is soft.     Tenderness: There is no abdominal tenderness.  Musculoskeletal:        General: No swelling.     Cervical back: Neck supple.  Skin:    General: Skin is warm and dry.     Capillary Refill: Capillary refill takes less than 2 seconds.  Neurological:     Mental Status: She is alert.  Psychiatric:        Mood and Affect: Mood normal.      UC Treatments / Results  Labs (all labs ordered are listed, but only abnormal results are displayed) Labs Reviewed  CERVICOVAGINAL ANCILLARY ONLY    EKG   Radiology No results found.  Procedures Procedures (including critical care time)  Medications Ordered in UC Medications - No data to display  Initial Impression / Assessment and Plan / UC Course  I have reviewed the triage vital signs and the nursing notes.  Pertinent labs & imaging results that were available during my care of the patient were reviewed by me and considered in my medical decision making (see chart for details).     Vaginal discharge  Hand tingling    We have tested you today and we will contact you if we need to arrange additional treatment based on your testing. Abstain from sex until you receive your final results.  Use a condom for the sexual encounter.  Use hypoallergenic soaps  and detergents and wear loosefitting cotton underwear.  If you have any worsening or changing symptoms including abnormal  discharge, pelvic pain, abdominal pain, fever, nausea, vomiting you should be reevaluated.   Diflucan called in for treatment of possible yeast infection.  And symptoms most likely musculoskeletal in nature given that they occur during activity and are better when at rest.  Keep a log of your hand symptoms and if they are persistent, then may need to see a orthopedist with hand speciality.  Final Clinical Impressions(s) / UC Diagnoses   Final diagnoses:  None   Discharge Instructions   None    ED Prescriptions   None    PDMP not reviewed this encounter.   Landis Martins, [place] 04/14/23 1722

## 2023-04-14 NOTE — ED Notes (Signed)
This RN reviewed pt's AVS at this time. Pt verbalized understanding. Pt to pick up prescription at pharmacy. Pt's respirations regular/unlabored/even, gait steady, Aox4 on departure.

## 2023-04-15 LAB — CERVICOVAGINAL ANCILLARY ONLY
Bacterial Vaginitis (gardnerella): POSITIVE — AB
Chlamydia: NEGATIVE
Comment: NEGATIVE
Comment: NEGATIVE
Comment: NEGATIVE
Comment: NORMAL
Neisseria Gonorrhea: NEGATIVE
Trichomonas: NEGATIVE

## 2023-04-16 ENCOUNTER — Telehealth: Payer: Self-pay

## 2023-04-16 MED ORDER — METRONIDAZOLE 500 MG PO TABS: 500.0000 mg | ORAL_TABLET | Freq: Two times a day (BID) | ORAL | 0 refills | Status: DC

## 2023-04-28 ENCOUNTER — Ambulatory Visit (HOSPITAL_COMMUNITY)
Admission: EM | Admit: 2023-04-28 | Discharge: 2023-04-28 | Disposition: A | Discharge status: Home / Self Care | Payer: Medicaid Other | Attending: Physician Assistant | Admitting: Physician Assistant

## 2023-04-28 ENCOUNTER — Encounter (HOSPITAL_COMMUNITY): Payer: Self-pay

## 2023-04-28 DIAGNOSIS — J40 Bronchitis, not specified as acute or chronic: Secondary | ICD-10-CM

## 2023-04-28 DIAGNOSIS — J329 Chronic sinusitis, unspecified: Secondary | ICD-10-CM

## 2023-04-28 DIAGNOSIS — J4521 Mild intermittent asthma with (acute) exacerbation: Secondary | ICD-10-CM

## 2023-04-28 LAB — POC COVID19/FLU A&B COMBO
Covid Antigen, POC: NEGATIVE
Influenza A Antigen, POC: NEGATIVE
Influenza B Antigen, POC: NEGATIVE

## 2023-04-28 MED ORDER — METHYLPREDNISOLONE SODIUM SUCC 125 MG IJ SOLR
80.0000 mg | Freq: Once | INTRAMUSCULAR | Status: AC
  Administered 2023-04-28: 80 mg via INTRAMUSCULAR

## 2023-04-28 MED ORDER — IBUPROFEN 800 MG PO TABS
800.0000 mg | ORAL_TABLET | Freq: Once | ORAL | Status: AC
  Administered 2023-04-28: 800 mg via ORAL

## 2023-04-28 MED ORDER — IBUPROFEN 800 MG PO TABS
ORAL_TABLET | ORAL | Status: AC
  Filled 2023-04-28: qty 1

## 2023-04-28 MED ORDER — AMOXICILLIN-POT CLAVULANATE 875-125 MG PO TABS: 1.0000 | ORAL_TABLET | Freq: Two times a day (BID) | ORAL | 0 refills | Status: AC

## 2023-04-28 MED ORDER — IPRATROPIUM-ALBUTEROL 0.5-2.5 (3) MG/3ML IN SOLN
RESPIRATORY_TRACT | Status: AC
  Filled 2023-04-28: qty 3

## 2023-04-28 MED ORDER — BENZONATATE 100 MG PO CAPS: 100.0000 mg | ORAL_CAPSULE | Freq: Three times a day (TID) | ORAL | 0 refills | Status: AC

## 2023-04-28 MED ORDER — ALBUTEROL SULFATE HFA 108 (90 BASE) MCG/ACT IN AERS: 1.0000 | INHALATION_SPRAY | Freq: Four times a day (QID) | RESPIRATORY_TRACT | 0 refills | Status: AC | PRN

## 2023-04-28 MED ORDER — METHYLPREDNISOLONE SODIUM SUCC 125 MG IJ SOLR
INTRAMUSCULAR | Status: AC
  Filled 2023-04-28: qty 2

## 2023-04-28 MED ORDER — IPRATROPIUM-ALBUTEROL 0.5-2.5 (3) MG/3ML IN SOLN
3.0000 mL | Freq: Once | RESPIRATORY_TRACT | Status: AC
  Administered 2023-04-28: 3 mL via RESPIRATORY_TRACT

## 2023-04-28 MED ORDER — PREDNISONE 20 MG PO TABS: 40.0000 mg | ORAL_TABLET | Freq: Every day | ORAL | 0 refills | Status: AC

## 2023-04-28 NOTE — Discharge Instructions (Signed)
You tested negative for COVID and flu.  I am concerned that you have a sinus/bronchitis infection that is triggering your asthma.  Start Augmentin twice daily for 7 days.  Start prednisone tomorrow (04/29/2023).  Do not take NSAIDs with this medication including aspirin, ibuprofen/Advil, naproxen/Aleve.  Use the albuterol inhaler every 4-6 hours as needed.  Take Tessalon for cough.  Use over-the-counter medications including Tylenol, Mucinex, Flonase, nasal saline/sinus rinses.  Make sure you rest and drink plenty of fluid.  If your symptoms are not improving within a few days or if anything worsens and you have high fever, worsening cough, shortness of breath despite medication, weakness, chest pain, nausea/vomiting interfering with oral intake you need to be seen immediately.

## 2023-04-28 NOTE — ED Provider Notes (Signed)
[facility]    CSN: [id number] Arrival date & time: 04/28/23  1621      History   Chief Complaint Chief Complaint  Patient presents with   Cough    HPI [name] is a 52 y.o. female.   Patient presents today with a 4-day history of URI symptoms.  She reports nasal congestion, sinus pressure, body aches, shortness of breath, wheezing, chills, headache.  Denies any diarrhea, vomiting, chest pain.  She has taken Tylenol and TheraFlu without improvement of symptoms.  She denies any known sick contacts but [place] so was exposed to many people.  She denies any recent antibiotics or steroids in the past 90 days; she was prescribed metronidazole for BV 2 weeks ago but has not yet started this medication.  She does report a history of asthma when she was much younger but has not needed albuterol recently except occasionally when she gets sick.  She denies any recent hospitalization for asthma.  She does have a history of smoking and vaping.  Denies formal diagnosis of COPD.  She denies history of diabetes.  She is confident that she is not pregnant.  She is having difficulty with her daily tibias as a result of symptoms.  She has had COVID several years ago.  She had initial COVID vaccines but not any of the recent boosters.    Past Medical History:  Diagnosis Date   Back pain    BV (bacterial vaginosis)    Chest pain    Depression    Diabetes mellitus without complication (HCC)    PID (acute pelvic inflammatory disease)    PTSD (post-traumatic stress disorder)     Patient Active Problem List   Diagnosis Date Noted   Lipoma 04/23/2007   OBESITY 04/23/2007   TOBACCO ABUSE 04/23/2007   DEPRESSION 04/23/2007   Allergic rhinitis 04/23/2007    Past Surgical History:  Procedure Laterality Date   APPENDECTOMY     DILATION AND EVACUATION  05/12/2011   Procedure: DILATATION AND EVACUATION (D&E);  Surgeon: Lazaro Arms, MD;  Location: [facility];   Service: Gynecology;  Laterality: N/A;    OB History     Gravida  8   Para  4   Term  4   Preterm  0   AB  2   Living  4      SAB  1   IAB  1   Ectopic  0   Multiple  0   Live Births               Home Medications    Prior to Admission medications   Medication Sig Start Date End Date Taking? Authorizing Provider  albuterol (VENTOLIN HFA) 108 (90 Base) MCG/ACT inhaler Inhale 1-2 puffs into the lungs every 6 (six) hours as needed for wheezing or shortness of breath. 04/28/23  Yes Arpita Fentress K, PA-C  amoxicillin-clavulanate (AUGMENTIN) 875-125 MG tablet Take 1 tablet by mouth every 12 (twelve) hours. 04/28/23  Yes Greysyn Vanderberg K, PA-C  benzonatate (TESSALON) 100 MG capsule Take 1 capsule (100 mg total) by mouth every 8 (eight) hours. 04/28/23  Yes Anan Dapolito K, PA-C  predniSONE (DELTASONE) 20 MG tablet Take 2 tablets (40 mg total) by mouth daily for 4 days. 04/28/23 05/02/23 Yes Duanne Duchesne K, PA-C  metroNIDAZOLE (FLAGYL) 500 MG tablet Take 1 tablet (500 mg total) by mouth 2 (two) times daily. 04/16/23   Merrilee Jansky,  MD    Family History Family History  Problem Relation Age of Onset   Diabetes Mother    Asthma Mother     Social History Social History   Tobacco Use   Smoking status: Every Day    Current packs/day: 0.50    Types: Cigarettes  Substance Use Topics   Alcohol use: No   Drug use: No     Allergies   Bee venom and Morphine   Review of Systems Review of Systems  Constitutional:  Positive for activity change and chills. Negative for appetite change, fatigue and fever.  HENT:  Positive for congestion and sore throat. Negative for sinus pressure and sneezing.   Respiratory:  Positive for cough, chest tightness, shortness of breath and wheezing.   Cardiovascular:  Negative for chest pain.  Gastrointestinal:  Positive for nausea. Negative for abdominal pain, diarrhea and vomiting.  Musculoskeletal:  Positive for arthralgias and  myalgias.  Neurological:  Positive for headaches. Negative for dizziness and light-headedness.     Physical Exam Triage Vital Signs ED Triage Vitals  Encounter Vitals Group     BP 04/28/23 1734 (!) 141/79     Systolic BP Percentile --      Diastolic BP Percentile --      Pulse Rate 04/28/23 1734 69     Resp 04/28/23 1734 16     Temp 04/28/23 1734 98.4 F (36.9 C)     Temp Source 04/28/23 1734 Oral     SpO2 04/28/23 1734 98 %     Weight 04/28/23 1734 165 lb (74.8 kg)     Height 04/28/23 1734 5\' 3"  (1.6 m)     Head Circumference --      Peak Flow --      Pain Score 04/28/23 1733 7     Pain Loc --      Pain Education --      Exclude from Growth Chart --    No data found.  Updated Vital Signs BP (!) 141/79 (BP Location: Left Arm)   Pulse 69   Temp 98.4 F (36.9 C) (Oral)   Resp 16   Ht 5\' 3"  (1.6 m)   Wt 165 lb (74.8 kg)   LMP  (LMP Unknown)   SpO2 98%   BMI 29.23 kg/m   Visual Acuity Right Eye Distance:   Left Eye Distance:   Bilateral Distance:    Right Eye Near:   Left Eye Near:    Bilateral Near:     Physical Exam Vitals reviewed.  Constitutional:      General: She is awake. She is not in acute distress.    Appearance: Normal appearance. She is well-developed. She is not ill-appearing.     Comments: Very pleasant female appears stated age in no acute distress sitting comfortably in exam room  HENT:     Head: Normocephalic and atraumatic.     Right Ear: External ear normal. There is impacted cerumen. Tympanic membrane is not erythematous or bulging.     Left Ear: Tympanic membrane, ear canal and external ear normal. Tympanic membrane is not erythematous or bulging.     Ears:     Comments: Right ear: Cerumen impaction noted.  Able to visualize approximately 30% of TM that appears normal.    Nose: Nose normal.     Mouth/Throat:     Pharynx: Uvula midline. Postnasal drip present. No oropharyngeal exudate or posterior oropharyngeal erythema.   Cardiovascular:     Rate and Rhythm: Normal  rate and regular rhythm.     Heart sounds: Normal heart sounds, S1 normal and S2 normal. No murmur heard. Pulmonary:     Effort: Pulmonary effort is normal.     Breath sounds: Wheezing present. No rhonchi or rales.     Comments: Widespread wheezing Psychiatric:        Behavior: Behavior is cooperative.      UC Treatments / Results  Labs (all labs ordered are listed, but only abnormal results are displayed) Labs Reviewed  POC COVID19/FLU A&B COMBO    EKG   Radiology No results found.  Procedures Procedures (including critical care time)  Medications Ordered in UC Medications  ipratropium-albuterol (DUONEB) 0.5-2.5 (3) MG/3ML nebulizer solution 3 mL (3 mLs Nebulization Given 04/28/23 1802)  methylPREDNISolone sodium succinate (SOLU-MEDROL) 125 mg/2 mL injection 80 mg (80 mg Intramuscular Given 04/28/23 1802)  ibuprofen (ADVIL) tablet 800 mg (800 mg Oral Given 04/28/23 1829)    Initial Impression / Assessment and Plan / UC Course  I have reviewed the triage vital signs and the nursing notes.  Pertinent labs & imaging results that were available during my care of the patient were reviewed by me and considered in my medical decision making (see chart for details).     Patient is well-appearing, afebrile, nontoxic, nontachycardic.  Viral testing was obtained per her request and was negative for flu and COVID.  Given her recent double worsening of symptoms will cover for secondary bacterial infection with Augmentin twice daily for 7 days.  She did have significant wheezing on exam and so was treated with Solu-Medrol and DuoNeb.  This improved her symptoms; chest x-ray was deferred as her oxygen saturation was 98% and wheezing resolved with medication in clinic.  I do suspect she has an asthma exacerbation likely triggered by acute respiratory illness.  Will start prednisone tomorrow given she was provided an injection of Solu-Medrol  today and she was instructed not to take NSAIDs with this medication due to risk of GI bleeding.  She can use Tessalon for cough.  Recommend over-the-counter medication such as Tylenol, Mucinex, Flonase.  She is to rest and drink plenty of fluid.  She was given albuterol inhaler with instruction to use this every 4-6 hours as needed.  We discussed that if her symptoms are not significantly improved within a few days she should return for reevaluation.  If at any point she has worsening symptoms including worsening cough, chest pain, shortness of breath, fever not responding to medication, weakness, nausea/vomiting she needs to be seen emergently.  Strict return precautions given.  Work excuse note provided.  Final Clinical Impressions(s) / UC Diagnoses   Final diagnoses:  Sinobronchitis  Mild intermittent asthma with acute exacerbation     Discharge Instructions      You tested negative for COVID and flu.  I am concerned that you have a sinus/bronchitis infection that is triggering your asthma.  Start Augmentin twice daily for 7 days.  Start prednisone tomorrow (04/29/2023).  Do not take NSAIDs with this medication including aspirin, ibuprofen/Advil, naproxen/Aleve.  Use the albuterol inhaler every 4-6 hours as needed.  Take Tessalon for cough.  Use over-the-counter medications including Tylenol, Mucinex, Flonase, nasal saline/sinus rinses.  Make sure you rest and drink plenty of fluid.  If your symptoms are not improving within a few days or if anything worsens and you have high fever, worsening cough, shortness of breath despite medication, weakness, chest pain, nausea/vomiting interfering with oral intake you need to be seen  immediately.     ED Prescriptions     Medication Sig Dispense Auth. Provider   amoxicillin-clavulanate (AUGMENTIN) 875-125 MG tablet Take 1 tablet by mouth every 12 (twelve) hours. 14 tablet Girl Schissler K, PA-C   predniSONE (DELTASONE) 20 MG tablet Take 2 tablets (40 mg  total) by mouth daily for 4 days. 8 tablet Quaran Kedzierski K, PA-C   albuterol (VENTOLIN HFA) 108 (90 Base) MCG/ACT inhaler Inhale 1-2 puffs into the lungs every 6 (six) hours as needed for wheezing or shortness of breath. 18 g Ladeana Laplant K, PA-C   benzonatate (TESSALON) 100 MG capsule Take 1 capsule (100 mg total) by mouth every 8 (eight) hours. 21 capsule Shyan Scalisi K, PA-C      PDMP not reviewed this encounter.   Jeani Hawking, PA-C 04/28/23 1908

## 2023-04-28 NOTE — ED Triage Notes (Signed)
Patient here today with c/o productive cough, body aches, SOB, wheeze, chills, sweats, and headache X 4 days. She has been taking Tylenol and Theraflu with no relief. Patient was here 2 weeks ago for BV and still having those symptoms as well but she did not get the metronidazole filled yet.

## 2023-05-06 ENCOUNTER — Telehealth (HOSPITAL_COMMUNITY): Payer: Self-pay | Admitting: *Deleted

## 2023-05-06 MED ORDER — METRONIDAZOLE 500 MG PO TABS: 500.0000 mg | ORAL_TABLET | Freq: Two times a day (BID) | ORAL | 0 refills | Status: DC

## 2023-05-06 NOTE — Telephone Encounter (Signed)
Pt called and she wanted BV treatment sent to [facility] on [address]. Pt advised rx resent to correct pharmacy

## 2024-06-30 ENCOUNTER — Encounter (HOSPITAL_COMMUNITY): Payer: Self-pay | Admitting: Emergency Medicine

## 2024-06-30 ENCOUNTER — Ambulatory Visit (HOSPITAL_COMMUNITY)
Admission: EM | Admit: 2024-06-30 | Discharge: 2024-06-30 | Disposition: A | Discharge status: Home / Self Care | Source: Home / Self Care

## 2024-06-30 DIAGNOSIS — J111 Influenza due to unidentified influenza virus with other respiratory manifestations: Secondary | ICD-10-CM

## 2024-06-30 DIAGNOSIS — R051 Acute cough: Secondary | ICD-10-CM

## 2024-06-30 LAB — POCT INFLUENZA A/B
Influenza A, POC: NEGATIVE
Influenza B, POC: NEGATIVE

## 2024-06-30 MED ORDER — PROMETHAZINE-DM 6.25-15 MG/5ML PO SYRP: 5.0000 mL | ORAL_SOLUTION | Freq: Every evening | ORAL | 0 refills | Status: AC | PRN

## 2024-06-30 NOTE — ED Provider Notes (Signed)
 " [facility]    CSN: [id number] Arrival date & time: 06/30/24  1332      History   Chief Complaint Chief Complaint  Patient presents with   Cough   Nasal Congestion   Diarrhea   Generalized Body Aches    HPI [name] is a 53 y.o. female.   This 53 year old female presents with complaints of headache, dizziness, nasal congestion, cough, shortness of breath, nausea, vomiting, diarrhea, body aches onset Sunday.  Last episode of emesis yesterday.  Last episode of diarrhea earlier today.  She reports a flu outbreak at her place of employment.  She took a COVID test at work Sunday which was negative.  She has been taking TheraFlu and Tylenol  with minimal improvement of symptoms.  She denies sore throat, chest pain, abdominal pain.   Cough Associated symptoms: headaches, myalgias and shortness of breath   Associated symptoms: no chest pain, no chills, no ear pain, no fever, no rash and no sore throat   Diarrhea Associated symptoms: headaches, myalgias and vomiting   Associated symptoms: no abdominal pain, no chills and no fever     Past Medical History:  Diagnosis Date   Back pain    BV (bacterial vaginosis)    Chest pain    Depression    Diabetes mellitus without complication (HCC)    PID (acute pelvic inflammatory disease)    PTSD (post-traumatic stress disorder)     Patient Active Problem List   Diagnosis Date Noted   Lipoma 04/23/2007   OBESITY 04/23/2007   TOBACCO ABUSE 04/23/2007   DEPRESSION 04/23/2007   Allergic rhinitis 04/23/2007    Past Surgical History:  Procedure Laterality Date   APPENDECTOMY     DILATION AND EVACUATION  05/12/2011   Procedure: DILATATION AND EVACUATION (D&E);  Surgeon: Vonn VEAR Inch, MD;  Location: [facility];  Service: Gynecology;  Laterality: N/A;    OB History     Gravida  8   Para  4   Term  4   Preterm  0   AB  2   Living  4      SAB  1   IAB  1   Ectopic  0   Multiple  0   Live Births                Home Medications    Prior to Admission medications  Medication Sig Start Date End Date Taking? Authorizing Provider  promethazine -dextromethorphan (PROMETHAZINE -DM) 6.25-15 MG/5ML syrup Take 5 mLs by mouth at bedtime as needed for cough. 06/30/24  Yes Tyrena Gohr C, FNP  albuterol  (VENTOLIN  HFA) 108 (90 Base) MCG/ACT inhaler Inhale 1-2 puffs into the lungs every 6 (six) hours as needed for wheezing or shortness of breath. 04/28/23   Raspet, Erin K, PA-C  amoxicillin -clavulanate (AUGMENTIN ) 875-125 MG tablet Take 1 tablet by mouth every 12 (twelve) hours. 04/28/23   Raspet, Erin K, PA-C  benzonatate  (TESSALON ) 100 MG capsule Take 1 capsule (100 mg total) by mouth every 8 (eight) hours. 04/28/23   Raspet, Rocky POUR, PA-C    Family History Family History  Problem Relation Age of Onset   Diabetes Mother    Asthma Mother     Social History Social History[1]   Allergies   Bee venom and Morphine    Review of Systems Review of Systems  Constitutional:  Positive for activity change and appetite change. Negative for chills and fever.  HENT:  Positive for congestion. Negative for ear pain  and sore throat.   Eyes:  Negative for visual disturbance.  Respiratory:  Positive for cough and shortness of breath.   Cardiovascular:  Negative for chest pain.  Gastrointestinal:  Positive for diarrhea, nausea and vomiting. Negative for abdominal pain.  Musculoskeletal:  Positive for myalgias.  Skin:  Negative for color change and rash.  Neurological:  Positive for dizziness and headaches.  All other systems reviewed and are negative.    Physical Exam Triage Vital Signs ED Triage Vitals  Encounter Vitals Group     BP 06/30/24 1507 108/75     Girls Systolic BP Percentile --      Girls Diastolic BP Percentile --      Boys Systolic BP Percentile --      Boys Diastolic BP Percentile --      Pulse Rate 06/30/24 1507 84     Resp 06/30/24 1507 16     Temp 06/30/24 1507 99.3 F (37.4  C)     Temp Source 06/30/24 1507 Oral     SpO2 06/30/24 1507 97 %     Weight --      Height --      Head Circumference --      Peak Flow --      Pain Score 06/30/24 1505 5     Pain Loc --      Pain Education --      Exclude from Growth Chart --    No data found.  Updated Vital Signs BP 108/75 (BP Location: Left Arm)   Pulse 84   Temp 99.3 F (37.4 C) (Oral)   Resp 16   SpO2 97%   Visual Acuity Right Eye Distance:   Left Eye Distance:   Bilateral Distance:    Right Eye Near:   Left Eye Near:    Bilateral Near:     Physical Exam Vitals and nursing note reviewed.  Constitutional:      General: She is not in acute distress.    Appearance: She is well-developed. She is not toxic-appearing.     Comments: Pleasant female appearing stated age found sitting in chair in no acute distress.  HENT:     Head: Normocephalic and atraumatic.     Right Ear: Tympanic membrane and external ear normal.     Left Ear: Tympanic membrane and external ear normal.     Nose: Congestion present.     Mouth/Throat:     Lips: Pink.     Mouth: Mucous membranes are moist.     Pharynx: No oropharyngeal exudate or posterior oropharyngeal erythema.  Eyes:     Conjunctiva/sclera: Conjunctivae normal.  Cardiovascular:     Rate and Rhythm: Normal rate and regular rhythm.     Heart sounds: Normal heart sounds. No murmur heard. Pulmonary:     Effort: Pulmonary effort is normal. No respiratory distress.     Breath sounds: Normal breath sounds.  Abdominal:     General: Bowel sounds are normal.     Palpations: Abdomen is soft.     Tenderness: There is no abdominal tenderness.  Musculoskeletal:     Cervical back: Neck supple.  Skin:    General: Skin is warm and dry.     Capillary Refill: Capillary refill takes less than 2 seconds.  Neurological:     Mental Status: She is alert.  Psychiatric:        Mood and Affect: Mood normal.      UC Treatments / Results  Labs (all labs  ordered are  listed, but only abnormal results are displayed) Labs Reviewed  POCT INFLUENZA A/B    EKG   Radiology No results found.  Procedures Procedures (including critical care time)  Medications Ordered in UC Medications - No data to display  Initial Impression / Assessment and Plan / UC Course  I have reviewed the triage vital signs and the nursing notes.  Pertinent labs & imaging results that were available during my care of the patient were reviewed by me and considered in my medical decision making (see chart for details).     Vitals and triage reviewed, patient is hemodynamically stable.  Flu swab negative.  Presentation consistent with viral respiratory illness.  She is negative for prescription for promethazine -DM.  Advised supportive care with Tylenol  and/or ibuprofen , guaifenesin, saline nasal spray.  Advised honey water, humidifier at night.  Plan of care, follow-up care, return precautions given, no questions at this time.  Work note provided. Final Clinical Impressions(s) / UC Diagnoses   Final diagnoses:  Acute cough  Influenza-like illness     Discharge Instructions      You have a viral illness which will improve on its own with rest, fluids, and medications to help with your symptoms.  Tylenol  and/or ibuprofen , guaifenesin (plain mucinex), and saline nasal sprays may help relieve symptoms.   Two teaspoons of honey in 1 cup of warm water every 4-6 hours may help with throat pains.  Humidifier in room at nighttime may help soothe cough (clean well daily).   Take Promethazine  DM cough medication to help with your cough at nighttime so that you are able to sleep. Do not drive, drink alcohol, or go to work while taking this medication since it can make you sleepy. Only take this at nighttime.   For chest pain, shortness of breath, inability to keep food or fluids down without vomiting, fever that does not respond to tylenol  or motrin , or any other severe symptoms,  please go to the ER for further evaluation. Return to urgent care as needed, otherwise follow-up with PCP.       ED Prescriptions     Medication Sig Dispense Auth. Provider   promethazine -dextromethorphan (PROMETHAZINE -DM) 6.25-15 MG/5ML syrup Take 5 mLs by mouth at bedtime as needed for cough. 118 mL Natalye Kott C, FNP      PDMP not reviewed this encounter.    [1]  Social History Tobacco Use   Smoking status: Every Day    Current packs/day: 0.50    Types: Cigarettes  Vaping Use   Vaping status: Former  Substance Use Topics   Alcohol use: No   Drug use: No     Lennice Jon BROCKS, FNP 06/30/24 1628  "

## 2024-06-30 NOTE — ED Triage Notes (Signed)
 Patient has a cough, nasal congestion, body aches, and diarrhea that started Sunday.  Patient job has a outbreak of the flu.  Patient has been taking thera flu and tylenol .

## 2024-06-30 NOTE — Discharge Instructions (Addendum)
 You have a viral illness which will improve on its own with rest, fluids, and medications to help with your symptoms.  Tylenol  and/or ibuprofen , guaifenesin (plain mucinex), and saline nasal sprays may help relieve symptoms.   Two teaspoons of honey in 1 cup of warm water every 4-6 hours may help with throat pains.  Humidifier in room at nighttime may help soothe cough (clean well daily).   Take Promethazine  DM cough medication to help with your cough at nighttime so that you are able to sleep. Do not drive, drink alcohol, or go to work while taking this medication since it can make you sleepy. Only take this at nighttime.   For chest pain, shortness of breath, inability to keep food or fluids down without vomiting, fever that does not respond to tylenol  or motrin , or any other severe symptoms, please go to the ER for further evaluation. Return to urgent care as needed, otherwise follow-up with PCP.

## 2024-08-17 ENCOUNTER — Ambulatory Visit: Admitting: Medical
